# Patient Record
Sex: Female | Born: 1940 | Race: White | Hispanic: No | Marital: Single | Smoking: Current every day smoker
Health system: Southern US, Community
[De-identification: ages and names within clinical notes are randomized; demographics above are authoritative.]

## PROBLEM LIST (undated history)

## (undated) DIAGNOSIS — N3010 Interstitial cystitis (chronic) without hematuria: Secondary | ICD-10-CM

## (undated) DIAGNOSIS — M810 Age-related osteoporosis without current pathological fracture: Secondary | ICD-10-CM

## (undated) DIAGNOSIS — G8929 Other chronic pain: Secondary | ICD-10-CM

## (undated) DIAGNOSIS — I10 Essential (primary) hypertension: Secondary | ICD-10-CM

## (undated) DIAGNOSIS — C439 Malignant melanoma of skin, unspecified: Secondary | ICD-10-CM

## (undated) DIAGNOSIS — K449 Diaphragmatic hernia without obstruction or gangrene: Secondary | ICD-10-CM

## (undated) DIAGNOSIS — R3982 Chronic bladder pain: Secondary | ICD-10-CM

## (undated) DIAGNOSIS — R112 Nausea with vomiting, unspecified: Secondary | ICD-10-CM

## (undated) DIAGNOSIS — E7800 Pure hypercholesterolemia, unspecified: Secondary | ICD-10-CM

## (undated) DIAGNOSIS — Z8489 Family history of other specified conditions: Secondary | ICD-10-CM

## (undated) DIAGNOSIS — Z9889 Other specified postprocedural states: Secondary | ICD-10-CM

## (undated) DIAGNOSIS — F410 Panic disorder [episodic paroxysmal anxiety] without agoraphobia: Secondary | ICD-10-CM

## (undated) DIAGNOSIS — M1990 Unspecified osteoarthritis, unspecified site: Secondary | ICD-10-CM

## (undated) DIAGNOSIS — T4145XA Adverse effect of unspecified anesthetic, initial encounter: Secondary | ICD-10-CM

## (undated) DIAGNOSIS — E890 Postprocedural hypothyroidism: Secondary | ICD-10-CM

## (undated) DIAGNOSIS — E039 Hypothyroidism, unspecified: Secondary | ICD-10-CM

## (undated) DIAGNOSIS — K219 Gastro-esophageal reflux disease without esophagitis: Secondary | ICD-10-CM

## (undated) DIAGNOSIS — Z8659 Personal history of other mental and behavioral disorders: Secondary | ICD-10-CM

## (undated) DIAGNOSIS — K5790 Diverticulosis of intestine, part unspecified, without perforation or abscess without bleeding: Secondary | ICD-10-CM

## (undated) DIAGNOSIS — K08109 Complete loss of teeth, unspecified cause, unspecified class: Secondary | ICD-10-CM

## (undated) DIAGNOSIS — T8859XA Other complications of anesthesia, initial encounter: Secondary | ICD-10-CM

## (undated) DIAGNOSIS — Z972 Presence of dental prosthetic device (complete) (partial): Secondary | ICD-10-CM

## (undated) DIAGNOSIS — Z973 Presence of spectacles and contact lenses: Secondary | ICD-10-CM

## (undated) DIAGNOSIS — M545 Low back pain, unspecified: Secondary | ICD-10-CM

## (undated) HISTORY — PX: APPENDECTOMY: SHX54

## (undated) HISTORY — PX: TONSILLECTOMY: SUR1361

## (undated) HISTORY — PX: THYROIDECTOMY, PARTIAL: SHX18

## (undated) HISTORY — PX: TOTAL ABDOMINAL HYSTERECTOMY W/ BILATERAL SALPINGOOPHORECTOMY: SHX83

## (undated) HISTORY — PX: OVARIAN CYST REMOVAL: SHX89

## (undated) HISTORY — PX: SHOULDER SURGERY: SHX246

## (undated) HISTORY — PX: BASAL CELL CARCINOMA EXCISION: SHX1214

---
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
## 1969-07-24 HISTORY — PX: TOTAL ABDOMINAL HYSTERECTOMY W/ BILATERAL SALPINGOOPHORECTOMY: SHX83

## 1976-07-24 DIAGNOSIS — C439 Malignant melanoma of skin, unspecified: Secondary | ICD-10-CM

## 1976-07-24 DIAGNOSIS — Z85820 Personal history of malignant melanoma of skin: Secondary | ICD-10-CM

## 1976-07-24 HISTORY — DX: Personal history of malignant melanoma of skin: Z85.820

## 1976-07-24 HISTORY — DX: Malignant melanoma of skin, unspecified: C43.9

## 2000-02-28 ENCOUNTER — Encounter: Admission: RE | Admit: 2000-02-28 | Discharge: 2000-02-28 | Payer: Self-pay | Admitting: Urology

## 2000-02-28 ENCOUNTER — Encounter: Payer: Self-pay | Admitting: Urology

## 2001-09-06 ENCOUNTER — Emergency Department (HOSPITAL_COMMUNITY): Admission: EM | Admit: 2001-09-06 | Discharge: 2001-09-06 | Payer: Self-pay

## 2002-07-24 HISTORY — PX: SHOULDER ARTHROSCOPY: SHX128

## 2003-01-30 ENCOUNTER — Ambulatory Visit (HOSPITAL_BASED_OUTPATIENT_CLINIC_OR_DEPARTMENT_OTHER): Admission: RE | Admit: 2003-01-30 | Discharge: 2003-01-31 | Payer: Self-pay | Admitting: Orthopedic Surgery

## 2003-10-29 ENCOUNTER — Emergency Department (HOSPITAL_COMMUNITY): Admission: EM | Admit: 2003-10-29 | Discharge: 2003-10-29 | Payer: Self-pay | Admitting: Emergency Medicine

## 2004-07-24 HISTORY — PX: SHOULDER OPEN ROTATOR CUFF REPAIR: SHX2407

## 2004-10-07 ENCOUNTER — Encounter: Admission: RE | Admit: 2004-10-07 | Discharge: 2004-10-07 | Payer: Self-pay | Admitting: Orthopedic Surgery

## 2005-06-01 ENCOUNTER — Encounter: Admission: RE | Admit: 2005-06-01 | Discharge: 2005-06-01 | Payer: Self-pay | Admitting: Internal Medicine

## 2006-06-01 ENCOUNTER — Emergency Department (HOSPITAL_COMMUNITY): Admission: EM | Admit: 2006-06-01 | Discharge: 2006-06-01 | Payer: Self-pay | Admitting: Emergency Medicine

## 2006-07-29 ENCOUNTER — Emergency Department (HOSPITAL_COMMUNITY): Admission: EM | Admit: 2006-07-29 | Discharge: 2006-07-29 | Payer: Self-pay | Admitting: Emergency Medicine

## 2007-06-06 ENCOUNTER — Ambulatory Visit (HOSPITAL_COMMUNITY): Admission: RE | Admit: 2007-06-06 | Discharge: 2007-06-06 | Payer: Self-pay | Admitting: Gastroenterology

## 2007-08-14 ENCOUNTER — Emergency Department (HOSPITAL_COMMUNITY): Admission: EM | Admit: 2007-08-14 | Discharge: 2007-08-15 | Payer: Self-pay | Admitting: Emergency Medicine

## 2007-08-26 ENCOUNTER — Emergency Department (HOSPITAL_COMMUNITY): Admission: EM | Admit: 2007-08-26 | Discharge: 2007-08-26 | Payer: Self-pay | Admitting: Family Medicine

## 2008-07-04 ENCOUNTER — Encounter: Admission: RE | Admit: 2008-07-04 | Discharge: 2008-07-04 | Payer: Self-pay | Admitting: Orthopedic Surgery

## 2008-10-09 ENCOUNTER — Emergency Department (HOSPITAL_COMMUNITY): Admission: EM | Admit: 2008-10-09 | Discharge: 2008-10-09 | Payer: Self-pay | Admitting: Family Medicine

## 2009-02-04 ENCOUNTER — Encounter: Admission: RE | Admit: 2009-02-04 | Discharge: 2009-02-04 | Payer: Self-pay | Admitting: Internal Medicine

## 2009-10-27 ENCOUNTER — Emergency Department (HOSPITAL_COMMUNITY): Admission: EM | Admit: 2009-10-27 | Discharge: 2009-10-27 | Payer: Self-pay | Admitting: Family Medicine

## 2009-11-25 ENCOUNTER — Telehealth (INDEPENDENT_AMBULATORY_CARE_PROVIDER_SITE_OTHER): Payer: Self-pay | Admitting: *Deleted

## 2009-11-29 ENCOUNTER — Ambulatory Visit: Payer: Self-pay | Admitting: Internal Medicine

## 2009-11-29 ENCOUNTER — Ambulatory Visit: Payer: Self-pay

## 2009-11-29 ENCOUNTER — Encounter (HOSPITAL_COMMUNITY): Admission: RE | Admit: 2009-11-29 | Discharge: 2010-01-21 | Payer: Self-pay | Admitting: Internal Medicine

## 2010-08-25 NOTE — Assessment & Plan Note (Signed)
Summary: Cardiology Nuclear Study  Nuclear Med Background Indications for Stress Test: Evaluation for Ischemia   History: Asthma, CT/MRI, History of Chemo, Myocardial Perfusion Study  History Comments: 7-8 yrs ago([place]) MPS:OK per patient.  Symptoms: Chest Tightness, DOE, Fatigue, Rapid HR, SOB  Symptoms Comments: h/o anxiety/panic attacks. Last episode of CP:now, "ache"; tightness, last week.   Nuclear Pre-Procedure Cardiac Risk Factors: Family History - CAD, History of Smoking, Hypertension, Lipids Caffeine/Decaff Intake: None NPO After: 8:00 PM Lungs: Clear IV 0.9% NS with Angio Cath: 20g     IV Site: (R) AC IV Started by: Irean Hong RN Chest Size (in) 36     Cup Size C     Height (in): 60 Weight (lb): 138 BMI: 27.05 Tech Comments: Atenolol held x 24 hours  Nuclear Med Study 1 or 2 day study:  1 day     Stress Test Type:  Stress Reading MD:  Dietrich Pates, MD     Referring MD:  Larina Earthly, MD Resting Radionuclide:  Technetium 37m Tetrofosmin     Resting Radionuclide Dose:  11 mCi  Stress Radionuclide:  Technetium 62m Tetrofosmin     Stress Radionuclide Dose:  33 mCi   Stress Protocol Exercise Time (min):  7:31 min     Max HR:  142 bpm     Predicted Max HR:  154 bpm  Max Systolic BP: 158 mm Hg     Percent Max HR:  92.21 %     METS: 9.3 Rate Pressure Product:  32202    Stress Test Technologist:  Rea College CMA-N     Nuclear Technologist:  Domenic Polite CNMT  Rest Procedure  Myocardial perfusion imaging was performed at rest 45 minutes following the intravenous administration of Myoview Technetium 96m Tetrofosmin.  Stress Procedure  The patient exercised for  7:31.  The patient stopped due to fatigue.  She did c/o chest tightness, 5-6/10, with exercise that was relieved immediately with rest.  There were no diagnostic ST-T wave changes.  There were occasional PVC's and PAC's.  Myoview was injected at peak exercise and myocardial perfusion imaging was performed  after a brief delay.  QPS Raw Data Images:  Soft tissue (diaphragm, bowel activity, breast) surround heart. Stress Images:  There is normal uptake in all areas. Rest Images:  Normal homogeneous uptake in all areas of the myocardium. Subtraction (SDS):  No evidence of ischemia. Transient Ischemic Dilatation:  .96  (Normal <1.22)  Lung/Heart Ratio:  .33  (Normal <0.45)  Quantitative Gated Spect Images QGS EDV:  45 ml QGS ESV:  10 ml QGS EF:  78 %   Overall Impression  Exercise Capacity: Good exercise capacity. BP Response: Normal blood pressure response. Clinical Symptoms: Moderate chest pain. ECG Impression: No significant ST segment change suggestive of ischemia. Overall Impression: Normal stress nuclear study.

## 2010-08-25 NOTE — Progress Notes (Signed)
Summary: Nuclear Pre-Procedure  Phone Note Outgoing Call Call back at [facility] - [place] Phone [phone]   Call placed by: Stanton Kidney, EMT-P,  Nov 25, 2009 1:53 PM Action Taken: Phone Call Completed Summary of Call: Left message with information on Myoview Information Sheet (see scanned document for details).     Nuclear Med Background Indications for Stress Test: Evaluation for Ischemia   History: Asthma   Symptoms: Chest Pain    Nuclear Pre-Procedure Cardiac Risk Factors: Family History - CAD, History of Smoking, Lipids

## 2010-09-05 ENCOUNTER — Other Ambulatory Visit: Payer: Self-pay | Admitting: Orthopedic Surgery

## 2010-09-05 DIAGNOSIS — M25512 Pain in left shoulder: Secondary | ICD-10-CM

## 2010-09-07 ENCOUNTER — Ambulatory Visit
Admission: RE | Admit: 2010-09-07 | Discharge: 2010-09-07 | Disposition: A | Discharge status: Home / Self Care | Payer: Self-pay | Source: Ambulatory Visit | Attending: Orthopedic Surgery | Admitting: Orthopedic Surgery

## 2010-09-07 DIAGNOSIS — M25512 Pain in left shoulder: Secondary | ICD-10-CM

## 2010-12-09 NOTE — Op Note (Signed)
   [name], [name]                            ACCOUNT NO.:  [id number]   MEDICAL RECORD NO.:  [id number]                   PATIENT TYPE:  AMB   LOCATION:  DSC                                  FACILITY:  [facility]   PHYSICIAN:  Thera Flake., M.D.             DATE OF BIRTH:  23-Aug-1940   DATE OF PROCEDURE:  01/30/2003  DATE OF DISCHARGE:                                 OPERATIVE REPORT   INDICATIONS:  A 70 year old female with relatively severe left shoulder pain  not responding to conservative treatment, thought to be amenable to  outpatient arthroscopy, possible overnight.   PREOPERATIVE DIAGNOSES:  1. Torn anterior superior labrum.  2. Impingement, partial rotator cuff tear.  3. Partial biceps tendon tear.  4. Acromioclavicular joint arthritis.   POSTOPERATIVE DIAGNOSES:  1. Torn anterior superior labrum.  2. Impingement, partial rotator cuff tear.  3. Partial biceps tendon tear.  4. Acromioclavicular joint arthritis.   OPERATION:  1. Arthroscopic acromioplasty.  2. Arthroscopic debridement, torn labrum.  3. Arthroscopic excision, distal clavicle.   SURGEON:  Dyke Brackett, M.D.   ANESTHESIA:  General/block.   DESCRIPTION OF PROCEDURE:  Arthroscope through a posterior, lateral, and  anterior portal.  No degenerative change was seen in the joint.  There was  significant degenerative tearing in the anterior superior labrum.  A portion  of the biceps had flipped into the joint relative to an attritional-type  tear.  No instability of the biceps was noted.  Aggressive debridement of  the glenohumeral joint and labral tear was carried out separate from the  remainder of the debridement.  Diffuse tendinosis with partial cuff  thickness tear estimated at about 25% of thickness, which was debrided.  No  full-thickness tear appreciated.  The subacromial space was hyperemic and  inflamed.  The arthroscope was inserted in the subacromial space, followed  by aggressive  debridement of the bursa, acromioplasty, and distal clavicle  resection of about 1 cm of the distal clavicle.  This relieved the  impingement from the clavicle, degenerative change on the clavicle,  impingement from the edge of the acromion.  Superior _________ cuff showed  abrasion-  type tearing only without any overt severe tearing.  This was debrided as  well.  Shoulder drained free of fluid, the portals closed with nylon, a  lightly compressive dressing and sling applied.  Taken to the recovery room  in stable condition.                                               Thera Flake., M.D.    WDC/MEDQ  D:  01/30/2003  T:  01/31/2003  Job:  [id number]

## 2012-07-24 HISTORY — PX: MOHS SURGERY: SUR867

## 2014-03-03 ENCOUNTER — Other Ambulatory Visit: Payer: Self-pay | Admitting: Internal Medicine

## 2014-03-03 DIAGNOSIS — R109 Unspecified abdominal pain: Secondary | ICD-10-CM

## 2014-03-05 ENCOUNTER — Other Ambulatory Visit: Payer: Self-pay | Admitting: Internal Medicine

## 2014-03-05 ENCOUNTER — Ambulatory Visit
Admission: RE | Admit: 2014-03-05 | Discharge: 2014-03-05 | Disposition: A | Discharge status: Home / Self Care | Payer: Medicare Other | Source: Ambulatory Visit | Attending: Internal Medicine | Admitting: Internal Medicine

## 2014-03-05 DIAGNOSIS — R109 Unspecified abdominal pain: Secondary | ICD-10-CM

## 2014-03-05 HISTORY — DX: Pure hypercholesterolemia, unspecified: E78.00

## 2014-03-05 HISTORY — DX: Essential (primary) hypertension: I10

## 2016-04-27 ENCOUNTER — Emergency Department (HOSPITAL_COMMUNITY)
Admission: EM | Admit: 2016-04-27 | Discharge: 2016-04-27 | Disposition: A | Discharge status: Home / Self Care | Payer: Medicare Other | Attending: Emergency Medicine | Admitting: Emergency Medicine

## 2016-04-27 ENCOUNTER — Encounter (HOSPITAL_COMMUNITY): Payer: Self-pay | Admitting: *Deleted

## 2016-04-27 ENCOUNTER — Emergency Department (HOSPITAL_COMMUNITY): Payer: Medicare Other

## 2016-04-27 DIAGNOSIS — Z7982 Long term (current) use of aspirin: Secondary | ICD-10-CM | POA: Diagnosis not present

## 2016-04-27 DIAGNOSIS — Z79899 Other long term (current) drug therapy: Secondary | ICD-10-CM | POA: Diagnosis not present

## 2016-04-27 DIAGNOSIS — S52571A Other intraarticular fracture of lower end of right radius, initial encounter for closed fracture: Secondary | ICD-10-CM

## 2016-04-27 DIAGNOSIS — Y9389 Activity, other specified: Secondary | ICD-10-CM | POA: Diagnosis not present

## 2016-04-27 DIAGNOSIS — Y92096 Garden or yard of other non-institutional residence as the place of occurrence of the external cause: Secondary | ICD-10-CM | POA: Insufficient documentation

## 2016-04-27 DIAGNOSIS — S52615A Nondisplaced fracture of left ulna styloid process, initial encounter for closed fracture: Secondary | ICD-10-CM | POA: Insufficient documentation

## 2016-04-27 DIAGNOSIS — Z87891 Personal history of nicotine dependence: Secondary | ICD-10-CM | POA: Insufficient documentation

## 2016-04-27 DIAGNOSIS — W1839XA Other fall on same level, initial encounter: Secondary | ICD-10-CM | POA: Diagnosis not present

## 2016-04-27 DIAGNOSIS — S6992XA Unspecified injury of left wrist, hand and finger(s), initial encounter: Secondary | ICD-10-CM | POA: Diagnosis present

## 2016-04-27 DIAGNOSIS — S52572A Other intraarticular fracture of lower end of left radius, initial encounter for closed fracture: Secondary | ICD-10-CM | POA: Insufficient documentation

## 2016-04-27 DIAGNOSIS — I10 Essential (primary) hypertension: Secondary | ICD-10-CM | POA: Insufficient documentation

## 2016-04-27 DIAGNOSIS — Y999 Unspecified external cause status: Secondary | ICD-10-CM | POA: Diagnosis not present

## 2016-04-27 HISTORY — DX: Panic disorder (episodic paroxysmal anxiety): F41.0

## 2016-04-27 HISTORY — DX: Diaphragmatic hernia without obstruction or gangrene: K44.9

## 2016-04-27 LAB — BASIC METABOLIC PANEL
Anion gap: 10 (ref 5–15)
BUN: 11 mg/dL (ref 6–20)
CHLORIDE: 108 mmol/L (ref 101–111)
CO2: 22 mmol/L (ref 22–32)
Calcium: 8.7 mg/dL — ABNORMAL LOW (ref 8.9–10.3)
Creatinine, Ser: 0.67 mg/dL (ref 0.44–1.00)
GFR calc Af Amer: >60 mL/min (ref >60)
Glucose, Bld: 96 mg/dL (ref 65–99)
POTASSIUM: 4.2 mmol/L (ref 3.5–5.1)
SODIUM: 140 mmol/L (ref 135–145)

## 2016-04-27 LAB — CBC
HEMATOCRIT: 36.8 % (ref 36.0–46.0)
HEMOGLOBIN: 11.9 g/dL — AB (ref 12.0–15.0)
MCH: 30.1 pg (ref 26.0–34.0)
MCHC: 32.3 g/dL (ref 30.0–36.0)
MCV: 92.9 fL (ref 78.0–100.0)
Platelets: 216 10*3/uL (ref 150–400)
RBC: 3.96 MIL/uL (ref 3.87–5.11)
RDW: 14.2 % (ref 11.5–15.5)
WBC: 10.9 10*3/uL — ABNORMAL HIGH (ref 4.0–10.5)

## 2016-04-27 MED ORDER — HYDROMORPHONE HCL 1 MG/ML IJ SOLN
0.5000 mg | Freq: Once | INTRAMUSCULAR | Status: AC
  Administered 2016-04-27: 0.5 mg via INTRAVENOUS
  Filled 2016-04-27: qty 1

## 2016-04-27 MED ORDER — HYDROCODONE-ACETAMINOPHEN 5-325 MG PO TABS: 1.0000 | ORAL_TABLET | ORAL | 0 refills | Status: DC | PRN

## 2016-04-27 MED ORDER — HYDROCODONE-ACETAMINOPHEN 5-325 MG PO TABS
2.0000 | ORAL_TABLET | Freq: Once | ORAL | Status: AC
  Administered 2016-04-27: 2 via ORAL
  Filled 2016-04-27: qty 2

## 2016-04-27 NOTE — ED Notes (Signed)
MD at bedside. 

## 2016-04-27 NOTE — ED Provider Notes (Signed)
[facility] DEPT Provider Note   CSN: [id number] Arrival date & time: 04/27/16  1935     History   Chief Complaint Chief Complaint  Patient presents with  . Fall  . Wrist Pain  . Hypotension    HPI Karina [name] is a 75 y.o. female.  HPI Patient was doing yard work and she fell. She reports she put her arms out front of her to break her fall. Patient ended up landing on her right hand and wrist. She reports that she did not sustain any other injury. She reports she has a lot of swelling and pain in the right wrist now.  While in the waiting room, the patient had an episode of hypotension. As that she does have a tendency to get anxious and felt like she was getting panicky. At that time she got lightheaded and sweaty. Those symptoms have resolved. She reports she is more comfortable now resting in the bed. Past Medical History:  Diagnosis Date  . Hiatal hernia   . HTN (hypertension)   . Hypercholesterolemia   . Panic attacks     There are no active problems to display for this patient.   Past Surgical History:  Procedure Laterality Date  . OVARIAN CYST REMOVAL     x 2  . TOTAL ABDOMINAL HYSTERECTOMY W/ BILATERAL SALPINGOOPHORECTOMY      OB History    No data available       Home Medications    Prior to Admission medications   Medication Sig Start Date End Date Taking? Authorizing Provider  acetaminophen (TYLENOL) 325 MG tablet Take 325 mg by mouth every 6 (six) hours as needed for moderate pain.   Yes Historical Provider, MD  ALPRAZolam Duanne Moron) 0.5 MG tablet Take 0.5 mg by mouth 2 (two) times daily as needed. 03/30/16  Yes Historical Provider, MD  aspirin EC 81 MG tablet Take 81 mg by mouth daily.   Yes Historical Provider, MD  atenolol (TENORMIN) 50 MG tablet Take 50 mg by mouth daily. 02/13/16  Yes Historical Provider, MD  celecoxib (CELEBREX) 200 MG capsule Take 200 mg by mouth daily as needed. For neuropathy 02/08/16  Yes Historical Provider, MD    lisinopril-hydrochlorothiazide (PRINZIDE,ZESTORETIC) 20-25 MG tablet Take 1 tablet by mouth daily. 02/13/16  Yes Historical Provider, MD  Meth-Hyo-M Bl-Na Phos-Ph Sal (URIBEL) 118 MG CAPS Take 118 mg by mouth daily as needed (bladder).   Yes Historical Provider, MD  risedronate (ACTONEL) 150 MG tablet Take 150 mg by mouth every 30 (thirty) days. 03/13/16  Yes Historical Provider, MD  sertraline (ZOLOFT) 100 MG tablet Take 100 mg by mouth every morning. 03/07/16  Yes Historical Provider, MD  SYNTHROID 75 MCG tablet Take 75 mcg by mouth daily before breakfast. 03/07/16  Yes Historical Provider, MD  ZETIA 10 MG tablet Take 10 mg by mouth every evening. 04/16/16  Yes Historical Provider, MD  HYDROcodone-acetaminophen (NORCO/VICODIN) 5-325 MG tablet Take 1-2 tablets by mouth every 4 (four) hours as needed for moderate pain or severe pain. 04/27/16   [name], MD    Family History No family history on file.  Social History Social History  Substance Use Topics  . Smoking status: Former Smoker    Packs/day: 1.00    Years: 35.00    Types: Cigarettes    Quit date: 07/24/2008  . Smokeless tobacco: Never Used  . Alcohol use No     Allergies   Morphine and Propoxyphene hcl   Review of Systems Review of  Systems  10 Systems reviewed and are negative for acute change except as noted in the HPI.  Physical Exam Updated Vital Signs BP 111/75 (BP Location: Right Arm)   Pulse 67   Temp 97.4 F (36.3 C) (Oral)   Resp 18   Ht 5' (1.524 m)   Wt 127 lb (57.6 kg)   SpO2 94%   BMI 24.80 kg/m   Physical Exam  Constitutional: She is oriented to person, place, and time. She appears well-developed and well-nourished. No distress.  HENT:  Head: Normocephalic and atraumatic.  Nose: Nose normal.  Eyes: Conjunctivae and EOM are normal.  Neck: Neck supple.  Cardiovascular: Normal rate and regular rhythm.   No murmur heard. Pulmonary/Chest: Effort normal and breath sounds normal. No respiratory  distress. She exhibits no tenderness.  Abdominal: Soft. There is no tenderness.  Musculoskeletal: She exhibits tenderness and deformity.  Left wrist has swelling and deformity. Radial pulses intact. Patient has intact range of motion of the fingers. Fingers are warm and dry.  Lower extremities are normal. Patient has good range of motion with in tach strength to push and flex and extend.  Neurological: She is alert and oriented to person, place, and time. No cranial nerve deficit. She exhibits normal muscle tone. Coordination normal.  Skin: Skin is warm and dry.  Psychiatric: She has a normal mood and affect.  Nursing note and vitals reviewed.    ED Treatments / Results  Labs (all labs ordered are listed, but only abnormal results are displayed) Labs Reviewed  CBC - Abnormal; Notable for the following:       Result Value   WBC 10.9 (*)    Hemoglobin 11.9 (*)    All other components within normal limits  BASIC METABOLIC PANEL - Abnormal; Notable for the following:    Calcium 8.7 (*)    All other components within normal limits    EKG  EKG Interpretation None       Radiology Dg Forearm Left  Result Date: 04/27/2016 CLINICAL DATA:  Fall with wrist deformity EXAM: LEFT FOREARM - 2 VIEW COMPARISON:  None. FINDINGS: AP and lateral views of the left forearm. No significant elbow effusion. Radial head alignment is normal. Comminuted, dorsally displaced intra-articular fracture of the distal radius. Ulnar styloid process fracture is better seen on the wrist exam. IMPRESSION: Comminuted intra-articular fracture of the distal radius. Electronically Signed   By: Donavan Foil M.D.   On: 04/27/2016 22:05   Dg Wrist Complete Left  Result Date: 04/27/2016 CLINICAL DATA:  Fall with deformity of the left wrist EXAM: LEFT WRIST - COMPLETE 3+ VIEW COMPARISON:  None. FINDINGS: AP, oblique and lateral views of the left wrist demonstrate a comminuted, impacted intra-articular fracture of the  distal radius. There is 1/3 shaft diameter of dorsal displacement of the distal fracture fragment. Additional nondisplaced fracture of the ulnar-styloid process. IMPRESSION: 1. Comminuted, dorsally displaced, impacted intra-articular distal radius fracture 2. Nondisplaced ulnar styloid process fracture Electronically Signed   By: Donavan Foil M.D.   On: 04/27/2016 22:04    Procedures Procedures (including critical care time)  Medications Ordered in ED Medications  HYDROmorphone (DILAUDID) injection 0.5 mg (0.5 mg Intravenous Given 04/27/16 2241)  HYDROcodone-acetaminophen (NORCO/VICODIN) 5-325 MG per tablet 2 tablet (2 tablets Oral Given 04/27/16 2334)     Initial Impression / Assessment and Plan / ED Course  I have reviewed the triage vital signs and the nursing notes.  Pertinent labs & imaging results that were available during  my care of the patient were reviewed by me and considered in my medical decision making (see chart for details).  Clinical Course   patient has been placed in a sugar tong splint by orthopedic tec. Patient is neurovascularly intact post splinting.  Final Clinical Impressions(s) / ED Diagnoses   Final diagnoses:  Other closed intra-articular fracture of distal end of right radius, initial encounter  Patient is isolated wrist fracture. No other associated injury. Patient is neurovascularly intact. Splint has been placed. Patient did have an episode of hypotension and lightheadedness in the waiting room. This is consistent with vasovagal episode secondary to pain. She did not sustain other injury or have chest pain or shortness of breath to suggest any type of ischemic event. He is counseled on taking Vicodin for pain control also she will use ibuprofen for anti-inflammatory. Patient will elevate and ice the extremity. Splint care precautions have been reviewed.  New Prescriptions New Prescriptions   HYDROCODONE-ACETAMINOPHEN (NORCO/VICODIN) 5-325 MG TABLET    Take  1-2 tablets by mouth every 4 (four) hours as needed for moderate pain or severe pain.     [name], MD 04/27/16 2350

## 2016-04-27 NOTE — ED Triage Notes (Signed)
Patient stated she was working in the yard and stepped back falling backwards landing on her left arm.  Obvious deformity to left wrist, c/o feeling nauseated and feeling like she is going to pass out   Patient pale at this time

## 2016-04-27 NOTE — ED Notes (Signed)
Patient transported to X-ray 

## 2016-04-27 NOTE — Progress Notes (Signed)
Orthopedic Tech Progress Note Patient Details:  Karina [name] 01-21-1941 [name]  Ortho Devices Type of Ortho Device: Arm sling, Sugartong splint Ortho Device/Splint Location: Well padded fiberglass sugartong splint to Lt arm, Sling Lt arm Ortho Device/Splint Interventions: Ordered, Application   [name] 04/27/2016, 11:14 PM

## 2016-07-24 HISTORY — PX: ORIF WRIST FRACTURE: SHX2133

## 2017-03-28 ENCOUNTER — Encounter (HOSPITAL_COMMUNITY): Payer: Self-pay | Admitting: Emergency Medicine

## 2017-03-28 ENCOUNTER — Emergency Department (HOSPITAL_COMMUNITY): Payer: Medicare Other

## 2017-03-28 ENCOUNTER — Emergency Department (HOSPITAL_COMMUNITY)
Admission: EM | Admit: 2017-03-28 | Discharge: 2017-03-28 | Disposition: A | Discharge status: Home / Self Care | Payer: Medicare Other | Attending: Emergency Medicine | Admitting: Emergency Medicine

## 2017-03-28 DIAGNOSIS — S0993XA Unspecified injury of face, initial encounter: Secondary | ICD-10-CM | POA: Diagnosis present

## 2017-03-28 DIAGNOSIS — Y929 Unspecified place or not applicable: Secondary | ICD-10-CM | POA: Diagnosis not present

## 2017-03-28 DIAGNOSIS — I10 Essential (primary) hypertension: Secondary | ICD-10-CM | POA: Diagnosis not present

## 2017-03-28 DIAGNOSIS — Y9301 Activity, walking, marching and hiking: Secondary | ICD-10-CM | POA: Diagnosis not present

## 2017-03-28 DIAGNOSIS — E7800 Pure hypercholesterolemia, unspecified: Secondary | ICD-10-CM | POA: Diagnosis not present

## 2017-03-28 DIAGNOSIS — S022XXA Fracture of nasal bones, initial encounter for closed fracture: Secondary | ICD-10-CM | POA: Diagnosis not present

## 2017-03-28 DIAGNOSIS — F17210 Nicotine dependence, cigarettes, uncomplicated: Secondary | ICD-10-CM | POA: Insufficient documentation

## 2017-03-28 DIAGNOSIS — W0110XA Fall on same level from slipping, tripping and stumbling with subsequent striking against unspecified object, initial encounter: Secondary | ICD-10-CM | POA: Diagnosis not present

## 2017-03-28 DIAGNOSIS — Z7982 Long term (current) use of aspirin: Secondary | ICD-10-CM | POA: Insufficient documentation

## 2017-03-28 DIAGNOSIS — Z79899 Other long term (current) drug therapy: Secondary | ICD-10-CM | POA: Diagnosis not present

## 2017-03-28 DIAGNOSIS — Y998 Other external cause status: Secondary | ICD-10-CM | POA: Diagnosis not present

## 2017-03-28 DIAGNOSIS — W19XXXA Unspecified fall, initial encounter: Secondary | ICD-10-CM

## 2017-03-28 HISTORY — DX: Diverticulosis of intestine, part unspecified, without perforation or abscess without bleeding: K57.90

## 2017-03-28 HISTORY — DX: Malignant melanoma of skin, unspecified: C43.9

## 2017-03-28 HISTORY — DX: Hypothyroidism, unspecified: E03.9

## 2017-03-28 HISTORY — DX: Age-related osteoporosis without current pathological fracture: M81.0

## 2017-03-28 MED ORDER — OXYCODONE-ACETAMINOPHEN 5-325 MG PO TABS: 1.0000 | ORAL_TABLET | Freq: Four times a day (QID) | ORAL | 0 refills | Status: DC | PRN

## 2017-03-28 MED ORDER — ONDANSETRON 4 MG PO TBDP
4.0000 mg | ORAL_TABLET | Freq: Once | ORAL | Status: AC | PRN
  Administered 2017-03-28: 4 mg via ORAL

## 2017-03-28 MED ORDER — ONDANSETRON 4 MG PO TBDP
ORAL_TABLET | ORAL | Status: AC
  Filled 2017-03-28: qty 1

## 2017-03-28 MED ORDER — SALINE SPRAY 0.65 % NA SOLN
1.0000 | Freq: Once | NASAL | Status: AC
  Administered 2017-03-28: 1 via NASAL
  Filled 2017-03-28: qty 44

## 2017-03-28 MED ORDER — OXYCODONE-ACETAMINOPHEN 5-325 MG PO TABS
1.0000 | ORAL_TABLET | Freq: Once | ORAL | Status: AC
  Administered 2017-03-28: 1 via ORAL
  Filled 2017-03-28: qty 1

## 2017-03-28 MED ORDER — OXYCODONE-ACETAMINOPHEN 5-325 MG PO TABS
1.0000 | ORAL_TABLET | ORAL | Status: DC | PRN
  Administered 2017-03-28: 1 via ORAL

## 2017-03-28 MED ORDER — OXYCODONE-ACETAMINOPHEN 5-325 MG PO TABS
ORAL_TABLET | ORAL | Status: AC
  Filled 2017-03-28: qty 1

## 2017-03-28 NOTE — ED Provider Notes (Signed)
[facility] DEPT Provider Note   CSN: [id number] Arrival date & time: 03/28/17  0020     History   Chief Complaint Chief Complaint  Patient presents with  . Fall    HPI Anis Cinelli is a 76 y.o. female.  HPI  Vision presents after a fall with facial pain. Patient recalls a fall in its entirety, stating that she fell while walking her dog. She fell forward onto her face, right elbow. Since that time she has had pain in the face, with no malocclusion, no confusion, no disorientation, no loss of consciousness, no vision changes. She had some improvement in the pain level after she was provided Percocet here. There is also persistent pain in the right elbow, but no distal loss of sensation, strength, weakness. Patient was well prior to the fall. She is here with her daughter who assists with the history of present illness.  Past Medical History:  Diagnosis Date  . Diverticulosis   . Hiatal hernia   . HTN (hypertension)   . Hypercholesterolemia   . Hypothyroidism   . Melanoma ([place]) 1978   chemo  . Osteoporosis   . Panic attacks     There are no active problems to display for this patient.   Past Surgical History:  Procedure Laterality Date  . OVARIAN CYST REMOVAL     x 2  . TOTAL ABDOMINAL HYSTERECTOMY W/ BILATERAL SALPINGOOPHORECTOMY      OB History    No data available       Home Medications    Prior to Admission medications   Medication Sig Start Date End Date Taking? Authorizing Provider  acetaminophen (TYLENOL) 325 MG tablet Take 325 mg by mouth every 6 (six) hours as needed for moderate pain.    [provider]  ALPRAZolam Duanne Moron) 0.5 MG tablet Take 0.5 mg by mouth 2 (two) times daily as needed. 03/30/16   [provider]  aspirin EC 81 MG tablet Take 81 mg by mouth daily.    [provider]  atenolol (TENORMIN) 50 MG tablet Take 50 mg by mouth daily. 02/13/16   [provider]  celecoxib (CELEBREX) 200 MG capsule  Take 200 mg by mouth daily as needed. For neuropathy 02/08/16   [provider]  HYDROcodone-acetaminophen (NORCO/VICODIN) 5-325 MG tablet Take 1-2 tablets by mouth every 4 (four) hours as needed for moderate pain or severe pain. 04/27/16   [name], MD  lisinopril-hydrochlorothiazide (PRINZIDE,ZESTORETIC) 20-25 MG tablet Take 1 tablet by mouth daily. 02/13/16   [provider]  Meth-Hyo-M Bl-Na Phos-Ph Sal (URIBEL) 118 MG CAPS Take 118 mg by mouth daily as needed (bladder).    [provider]  risedronate (ACTONEL) 150 MG tablet Take 150 mg by mouth every 30 (thirty) days. 03/13/16   [provider]  sertraline (ZOLOFT) 100 MG tablet Take 100 mg by mouth every morning. 03/07/16   [provider]  SYNTHROID 75 MCG tablet Take 75 mcg by mouth daily before breakfast. 03/07/16   [provider]  ZETIA 10 MG tablet Take 10 mg by mouth every evening. 04/16/16   [provider]    Family History No family history on file.  Social History Social History  Substance Use Topics  . Smoking status: Current Some Day Smoker    Packs/day: 0.10    Years: 35.00    Types: Cigarettes    Last attempt to quit: 07/24/2008  . Smokeless tobacco: Never Used  . Alcohol use No  Allergies   Morphine and Propoxyphene hcl   Review of Systems Review of Systems  Constitutional:       Per HPI, otherwise negative  HENT:       Per HPI, otherwise negative  Respiratory:       Per HPI, otherwise negative  Cardiovascular:       Per HPI, otherwise negative  Gastrointestinal: Negative for vomiting.  Endocrine:       Negative aside from HPI  Genitourinary:       Neg aside from HPI   Musculoskeletal:       Per HPI, otherwise negative  Skin: Positive for wound.  Allergic/Immunologic: Negative for immunocompromised state.  Neurological: Negative for syncope.  Hematological: Does not bruise/bleed easily.     Physical Exam Updated Vital  Signs BP (!) 135/97 (BP Location: Left Arm)   Pulse 60   Temp 98.4 F (36.9 C)   Resp 18   SpO2 94%   Physical Exam  Constitutional: She is oriented to person, place, and time. She appears well-developed and well-nourished. No distress.  HENT:  Head: Normocephalic.    Mouth/Throat:    Eyes: Conjunctivae and EOM are normal.  Neck: No spinous process tenderness and no muscular tenderness present. No neck rigidity. No erythema and normal range of motion present.  Cardiovascular: Normal rate and regular rhythm.   Pulmonary/Chest: Effort normal and breath sounds normal. No stridor. No respiratory distress.  Abdominal: She exhibits no distension.  Musculoskeletal: She exhibits no edema.       Right shoulder: Normal.       Right elbow: She exhibits normal range of motion, no swelling, no effusion, no deformity and no laceration. Tenderness found.       Right wrist: Normal.  Neurological: She is alert and oriented to person, place, and time. No cranial nerve deficit.  Skin: Skin is warm and dry.  Psychiatric: She has a normal mood and affect.  Nursing note and vitals reviewed.    ED Treatments / Results   Radiology Dg Elbow Complete Right  Result Date: 03/28/2017 CLINICAL DATA:  Right elbow pain after fall tonight. Painful range of motion. EXAM: RIGHT ELBOW - COMPLETE 3+ VIEW COMPARISON:  None. FINDINGS: There is no evidence of fracture, dislocation, or joint effusion. Mild only trochlear degenerative change with spurring. Spurring adjacent to the lateral epicondyle likely enthesopathic change. Soft tissues are unremarkable. IMPRESSION: No acute fracture subluxation.  Mild degenerative change. Electronically Signed   By: Jeb Levering M.D.   On: 03/28/2017 01:33   Ct Head Wo Contrast  Result Date: 03/28/2017 CLINICAL DATA:  Trip and fall injury. Laceration below the bottom lip. Small laceration to bridge of nose. EXAM: CT HEAD WITHOUT CONTRAST CT MAXILLOFACIAL WITHOUT CONTRAST  TECHNIQUE: Multidetector CT imaging of the head and maxillofacial structures were performed using the standard protocol without intravenous contrast. Multiplanar CT image reconstructions of the maxillofacial structures were also generated. COMPARISON:  CT maxillofacial 08/14/2007 FINDINGS: CT HEAD FINDINGS Brain: No evidence of acute infarction, hemorrhage, hydrocephalus, extra-axial collection or mass lesion/mass effect. Vascular: No hyperdense vessel or unexpected calcification. Skull: Normal. Negative for fracture or focal lesion. Other: None. CT MAXILLOFACIAL FINDINGS Osseous: Mildly depressed nasal bone fractures most prominent on the left. Frontal bones, orbital rims, maxillary antral walls, pterygoid plates, zygomatic arches, mandibles, and temporomandibular joints are intact. Orbits: Negative. No traumatic or inflammatory finding. Sinuses: Clear. Soft tissues: Soft tissue laceration anterior and superior to the anterior mandible. No radiopaque soft tissue foreign bodies. IMPRESSION:  1. No acute intracranial abnormalities. 2. Mildly depressed nasal bone fractures most prominent on the left. 3. Soft tissue laceration over the anterior mandible. Electronically Signed   By: Lucienne Capers M.D.   On: 03/28/2017 05:29   Ct Maxillofacial Wo Cm  Result Date: 03/28/2017 CLINICAL DATA:  Trip and fall injury. Laceration below the bottom lip. Small laceration to bridge of nose. EXAM: CT HEAD WITHOUT CONTRAST CT MAXILLOFACIAL WITHOUT CONTRAST TECHNIQUE: Multidetector CT imaging of the head and maxillofacial structures were performed using the standard protocol without intravenous contrast. Multiplanar CT image reconstructions of the maxillofacial structures were also generated. COMPARISON:  CT maxillofacial 08/14/2007 FINDINGS: CT HEAD FINDINGS Brain: No evidence of acute infarction, hemorrhage, hydrocephalus, extra-axial collection or mass lesion/mass effect. Vascular: No hyperdense vessel or unexpected  calcification. Skull: Normal. Negative for fracture or focal lesion. Other: None. CT MAXILLOFACIAL FINDINGS Osseous: Mildly depressed nasal bone fractures most prominent on the left. Frontal bones, orbital rims, maxillary antral walls, pterygoid plates, zygomatic arches, mandibles, and temporomandibular joints are intact. Orbits: Negative. No traumatic or inflammatory finding. Sinuses: Clear. Soft tissues: Soft tissue laceration anterior and superior to the anterior mandible. No radiopaque soft tissue foreign bodies. IMPRESSION: 1. No acute intracranial abnormalities. 2. Mildly depressed nasal bone fractures most prominent on the left. 3. Soft tissue laceration over the anterior mandible. Electronically Signed   By: Lucienne Capers M.D.   On: 03/28/2017 05:29    Procedures Procedures (including critical care time)  Medications Ordered in ED Medications  oxyCODONE-acetaminophen (PERCOCET/ROXICET) 5-325 MG per tablet 1 tablet (1 tablet Oral Given 03/28/17 0040)  sodium chloride (OCEAN) 0.65 % nasal spray 1 spray (not administered)  ondansetron (ZOFRAN-ODT) disintegrating tablet 4 mg (4 mg Oral Given 03/28/17 0045)  oxyCODONE-acetaminophen (PERCOCET/ROXICET) 5-325 MG per tablet 1 tablet (1 tablet Oral Given 03/28/17 0421)     Initial Impression / Assessment and Plan / ED Course  I have reviewed the triage vital signs and the nursing notes.  Pertinent labs & imaging results that were available during my care of the patient were reviewed by me and considered in my medical decision making (see chart for details).  5:48 AM Patient awake and alert, in no distress, no active bleeding from the chin wound. We discussed all findings, with the patient's daughter. We discussed the importance of using nasal spray for hydration, close outpatient follow-up with ENT, and home wound care. With no evidence for intracranial pathology, no neurologic deficits, no distress, the patient was discharged in stable  condition.  Final Clinical Impressions(s) / ED Diagnoses  fall, initial encounter Nasal bonefracture   Carmin Muskrat, MD 03/28/17 [phone]

## 2017-03-28 NOTE — Discharge Instructions (Signed)
Please be sure to follow-up with our ENT colleagues. In the interim, please monitor your condition carefully, and do not hesitate to return here for concerning changes.

## 2017-03-28 NOTE — ED Notes (Signed)
Patient left at this time with all belongings. 

## 2017-03-28 NOTE — ED Triage Notes (Addendum)
Pt to ED following fall while out walking her dog this evening. Pt states her flip flop got underneath her foot and she tripped. Pt fell face forward onto gravel - she has lac below bottom lip going through to her gums, and small lac to bridge of nose. Nose is swollen and bruised. Pt states her nose feels numb. Pt also c/o R elbow pain, worse with movement. CSM intact all extremities. Pt denies LOC. Denies dizziness/lightheadedness, no headache.

## 2017-03-28 NOTE — ED Notes (Signed)
To CT at this time.

## 2017-11-05 ENCOUNTER — Ambulatory Visit (INDEPENDENT_AMBULATORY_CARE_PROVIDER_SITE_OTHER): Payer: Self-pay

## 2017-11-05 ENCOUNTER — Ambulatory Visit (INDEPENDENT_AMBULATORY_CARE_PROVIDER_SITE_OTHER): Payer: Medicare Other | Admitting: Orthopaedic Surgery

## 2017-11-05 ENCOUNTER — Encounter (INDEPENDENT_AMBULATORY_CARE_PROVIDER_SITE_OTHER): Payer: Self-pay | Admitting: Orthopaedic Surgery

## 2017-11-05 VITALS — BP 108/76 | HR 71 | Resp 16 | Ht <= 58 in | Wt 126.0 lb

## 2017-11-05 DIAGNOSIS — M25561 Pain in right knee: Secondary | ICD-10-CM

## 2017-11-05 DIAGNOSIS — M25551 Pain in right hip: Secondary | ICD-10-CM

## 2017-11-05 MED ORDER — BUPIVACAINE HCL 0.5 % IJ SOLN
2.0000 mL | INTRAMUSCULAR | Status: AC | PRN
  Administered 2017-11-05: 2 mL via INTRA_ARTICULAR

## 2017-11-05 MED ORDER — METHYLPREDNISOLONE ACETATE 40 MG/ML IJ SUSP
80.0000 mg | INTRAMUSCULAR | Status: AC | PRN
  Administered 2017-11-05: 80 mg

## 2017-11-05 MED ORDER — LIDOCAINE HCL 1 % IJ SOLN
2.0000 mL | INTRAMUSCULAR | Status: AC | PRN
  Administered 2017-11-05: 2 mL

## 2017-11-05 NOTE — Progress Notes (Signed)
XR PELVIS0   Office Visit Note   Patient: Karina [name]           Date of Birth: Jan 14, 1941           MRN: [id number] Visit Date: 11/05/2017              Requested by: Prince Solian, MD [address] [place], [place] [address] PCP: Prince Solian, MD   Assessment & Plan: Visit Diagnoses:  1. Acute pain of right knee   2. Pain in right hip     Plan: Osteoarthritis right knee and right hip.  I think both contribute to her right lower extremity pain.  She does have more pain and swelling in her right knee.  I will inject this with cortisone and monitor response over the next several weeks  Follow-Up Instructions: No follow-ups on file.   Orders:  Orders Placed This Encounter  Procedures  . XR Pelvis 1-2 Views  . XR KNEE 3 VIEW RIGHT   No orders of the defined types were placed in this encounter.     Procedures: Large Joint Inj: R knee on 11/05/2017 11:51 AM Indications: pain and diagnostic evaluation Details: 25 G 1.5 in needle, anteromedial approach  Arthrogram: No  Medications: 2 mL lidocaine 1 %; 2 mL bupivacaine 0.5 %; 80 mg methylPREDNISolone acetate 40 MG/ML Procedure, treatment alternatives, risks and benefits explained, specific risks discussed. Consent was given by the patient. Immediately prior to procedure a time out was called to verify the correct patient, procedure, equipment, support staff and site/side marked as required. Patient was prepped and draped in the usual sterile fashion.       Clinical Data: No additional findings.   Subjective: Chief Complaint  Patient presents with  . Right Knee - Pain  . New Patient (Initial Visit)    R KNEE PAIN FOR 4 MONTHS RADIATES FROM R HIP, HAD MALONOMIA IN RIGHT CALF IN 75'S  Karina [name] noted insidious onset of right knee pain "just before Christmas".  She denies any injury or trauma.  Her right knee has been swollen and oftentimes "achy and stiff".  She has had difficulty bearing weight on occasion  particularly when it is "swollen".  Has had some groin pain and thigh pain as well.  No numbness or tingling.  HPI  Review of Systems  Constitutional: Positive for fatigue. Negative for fever.  HENT: Negative for ear pain.   Eyes: Negative for pain.  Respiratory: Negative for cough and shortness of breath.   Cardiovascular: Positive for leg swelling.  Gastrointestinal: Negative for constipation and diarrhea.  Genitourinary: Negative for difficulty urinating.  Musculoskeletal: Positive for neck pain. Negative for back pain.  Skin: Positive for rash.  Allergic/Immunologic: Negative for food allergies.  Neurological: Positive for weakness and numbness.  Hematological: Bruises/bleeds easily.  Psychiatric/Behavioral: Positive for sleep disturbance.     Objective: Vital Signs: BP 108/76 (BP Location: Left Arm, Patient Position: Sitting, Cuff Size: Normal)   Pulse 71   Resp 16   Ht 4\' 9"  (1.448 m)   Wt 126 lb (57.2 kg)   BMI 27.27 kg/m   Physical Exam  Ortho Exam awake alert and oriented x3.  Comfortable sitting positive effusion right knee with medial lateral joint pain.  Mild patellar does walk with a slight limp on the right lower extremity.  No distal edema.  Good pulses.  Pain with range of motion of her right hip with internal and external rotation.  No ambulatory aid.  Straight leg  raise negative.  Appears to have increased valgus with weightbearing right knee  Specialty Comments:  No specialty comments available.  Imaging: No results found.   PMFS History: There are no active problems to display for this patient.  Past Medical History:  Diagnosis Date  . Diverticulosis   . Hiatal hernia   . HTN (hypertension)   . Hypercholesterolemia   . Hypothyroidism   . Melanoma ([place]) 1978   chemo  . Osteoporosis   . Panic attacks     History reviewed. No pertinent family history.  Past Surgical History:  Procedure Laterality Date  . OVARIAN CYST REMOVAL     x 2  .  TOTAL ABDOMINAL HYSTERECTOMY W/ BILATERAL SALPINGOOPHORECTOMY     Social History   Occupational History  . Not on file  Tobacco Use  . Smoking status: Current Some Day Smoker    Packs/day: 0.10    Years: 35.00    Pack years: 3.50    Types: Cigarettes    Last attempt to quit: 07/24/2008    Years since quitting: 9.2  . Smokeless tobacco: Never Used  Substance and Sexual Activity  . Alcohol use: No  . Drug use: No  . Sexual activity: Not on file

## 2017-11-20 ENCOUNTER — Ambulatory Visit (INDEPENDENT_AMBULATORY_CARE_PROVIDER_SITE_OTHER): Payer: Medicare Other | Admitting: Orthopaedic Surgery

## 2017-11-20 ENCOUNTER — Encounter (INDEPENDENT_AMBULATORY_CARE_PROVIDER_SITE_OTHER): Payer: Self-pay | Admitting: Orthopaedic Surgery

## 2017-11-20 ENCOUNTER — Other Ambulatory Visit (INDEPENDENT_AMBULATORY_CARE_PROVIDER_SITE_OTHER): Payer: Self-pay | Admitting: Radiology

## 2017-11-20 VITALS — BP 80/60 | HR 70 | Resp 18 | Ht <= 58 in | Wt 124.0 lb

## 2017-11-20 DIAGNOSIS — M1611 Unilateral primary osteoarthritis, right hip: Secondary | ICD-10-CM | POA: Diagnosis not present

## 2017-11-20 DIAGNOSIS — M25551 Pain in right hip: Secondary | ICD-10-CM

## 2017-11-20 NOTE — Progress Notes (Signed)
Office Visit Note   Patient: Karina [name]           Date of Birth: May 21, 1941           MRN: [id number] Visit Date: 11/20/2017              Requested by: Prince Solian, MD [address] [place], [place] [address] PCP: Prince Solian, MD   Assessment & Plan: Visit Diagnoses:  1. Primary osteoarthritis of right hip     Plan:  #1: At this time our plan is to proceed with an corticosteroid injection to the right hip.  Hopefully we will see how this improves her symptoms from her groin pain and down into the knee.  Follow-Up Instructions: No follow-ups on file.   Orders:  No orders of the defined types were placed in this encounter.  No orders of the defined types were placed in this encounter.     Procedures: No procedures performed   Clinical Data: No additional findings.   Subjective: Chief Complaint  Patient presents with  . Right Knee - Follow-up  . Follow-up    R KNEE PAIN 2 WK F/U WORKED FOR A FEW DAYS THEN THE PAIN CAME BACK, PAIN IN HIP AREA    HPI  [name] is seen today for evaluation of her right leg pain.  She was last seen on November 05, 2017 by Dr. Durward Fortes with a 27-month history of right knee pain with radiating from her right hip.  This is started just before Christmas.  She denied any history of injury or trauma.  At times has had some swelling in the right knee as well as some aching and stiffness.  She has had difficulty bearing weight on occasions particularly when it is swollen.  She also today complains of more groin pain and pain down the anterior thigh to the knee.  Denies any numbness or tingling.  Review of Systems  Constitutional: Positive for fatigue. Negative for fever.  HENT: Positive for ear pain.   Eyes: Negative for pain.  Respiratory: Negative for cough and shortness of breath.   Cardiovascular: Positive for leg swelling.  Gastrointestinal: Negative for constipation and diarrhea.  Genitourinary: Negative for difficulty urinating.    Musculoskeletal: Positive for back pain and neck pain.  Skin: Negative for rash.  Allergic/Immunologic: Negative for food allergies.  Hematological: Bruises/bleeds easily.  Psychiatric/Behavioral: Positive for sleep disturbance.     Objective: Vital Signs: BP (!) 80/60 (BP Location: Left Arm, Patient Position: Sitting, Cuff Size: Normal)   Pulse 70   Resp 18   Ht 4\' 9"  (1.448 m)   Wt 124 lb (56.2 kg)   BMI 26.83 kg/m   Physical Exam  Constitutional: She is oriented to person, place, and time. She appears well-developed and well-nourished.  HENT:  Mouth/Throat: Oropharynx is clear and moist.  Eyes: Pupils are equal, round, and reactive to light. EOM are normal.  Pulmonary/Chest: Effort normal.  Neurological: She is alert and oriented to person, place, and time.  Skin: Skin is warm and dry.  Psychiatric: She has a normal mood and affect. Her behavior is normal.    Ortho Exam  Today examination of the right knee reveals minimal effusion.  No warmth or erythema.  She does have been the right hip limited range of motion in comparison to the left hip.  She does have pain at the extremes.  She certainly is limited in external rotation more than internal rotation in comparison to the left hip.  Negative straight leg raise at 90 degrees.  Specialty Comments:  No specialty comments available.  Imaging: No results found.   PMFS History: Current Outpatient Medications  Medication Sig Dispense Refill  . acetaminophen (TYLENOL) 325 MG tablet Take 325 mg by mouth every 6 (six) hours as needed for moderate pain.    Marland Kitchen ALPRAZolam (XANAX) 0.5 MG tablet Take 0.5 mg by mouth 2 (two) times daily as needed.    Marland Kitchen aspirin EC 81 MG tablet Take 81 mg by mouth daily.    Marland Kitchen atenolol (TENORMIN) 50 MG tablet Take 50 mg by mouth daily.    . celecoxib (CELEBREX) 200 MG capsule Take 200 mg by mouth daily as needed. For neuropathy    . Meth-Hyo-M Bl-Na Phos-Ph Sal (URIBEL) 118 MG CAPS Take 118 mg by  mouth daily as needed (bladder).    Marland Kitchen olmesartan-hydrochlorothiazide (BENICAR HCT) 20-12.5 MG tablet Take 1 tablet by mouth daily.    . risedronate (ACTONEL) 150 MG tablet Take 150 mg by mouth every 30 (thirty) days.    Marland Kitchen sertraline (ZOLOFT) 100 MG tablet Take 100 mg by mouth every morning.    Marland Kitchen SYNTHROID 75 MCG tablet Take 75 mcg by mouth daily before breakfast.    . traMADol (ULTRAM) 50 MG tablet Take 50 mg by mouth every 8 (eight) hours as needed.  1  . ZETIA 10 MG tablet Take 10 mg by mouth every evening.    Marland Kitchen HYDROcodone-acetaminophen (NORCO/VICODIN) 5-325 MG tablet Take 1-2 tablets by mouth every 4 (four) hours as needed for moderate pain or severe pain. (Patient not taking: Reported on 11/05/2017) 20 tablet 0  . lisinopril-hydrochlorothiazide (PRINZIDE,ZESTORETIC) 20-25 MG tablet Take 1 tablet by mouth daily.    Marland Kitchen oxyCODONE-acetaminophen (PERCOCET/ROXICET) 5-325 MG tablet Take 1 tablet by mouth every 6 (six) hours as needed for severe pain. (Patient not taking: Reported on 11/05/2017) 15 tablet 0   No current facility-administered medications for this visit.     There are no active problems to display for this patient.  Past Medical History:  Diagnosis Date  . Diverticulosis   . Hiatal hernia   . HTN (hypertension)   . Hypercholesterolemia   . Hypothyroidism   . Melanoma ([place]) 1978   chemo  . Osteoporosis   . Panic attacks     History reviewed. No pertinent family history.  Past Surgical History:  Procedure Laterality Date  . APPENDECTOMY    . BASAL CELL CARCINOMA EXCISION    . OVARIAN CYST REMOVAL     x 2  . SHOULDER SURGERY    . THYROIDECTOMY, PARTIAL    . TOTAL ABDOMINAL HYSTERECTOMY W/ BILATERAL SALPINGOOPHORECTOMY     Social History   Occupational History  . Not on file  Tobacco Use  . Smoking status: Current Some Day Smoker    Packs/day: 0.10    Years: 35.00    Pack years: 3.50    Types: Cigarettes    Last attempt to quit: 07/24/2008    Years since quitting:  9.3  . Smokeless tobacco: Never Used  Substance and Sexual Activity  . Alcohol use: No  . Drug use: No  . Sexual activity: Not on file

## 2017-12-10 ENCOUNTER — Ambulatory Visit (INDEPENDENT_AMBULATORY_CARE_PROVIDER_SITE_OTHER): Payer: Medicare Other | Admitting: Physical Medicine and Rehabilitation

## 2017-12-10 ENCOUNTER — Encounter (INDEPENDENT_AMBULATORY_CARE_PROVIDER_SITE_OTHER): Payer: Self-pay | Admitting: Physical Medicine and Rehabilitation

## 2017-12-10 ENCOUNTER — Ambulatory Visit (INDEPENDENT_AMBULATORY_CARE_PROVIDER_SITE_OTHER): Payer: Medicare Other

## 2017-12-10 DIAGNOSIS — M25551 Pain in right hip: Secondary | ICD-10-CM | POA: Diagnosis not present

## 2017-12-10 MED ORDER — BUPIVACAINE HCL 0.5 % IJ SOLN
3.0000 mL | INTRAMUSCULAR | Status: AC | PRN
  Administered 2017-12-10: 3 mL via INTRA_ARTICULAR

## 2017-12-10 MED ORDER — TRIAMCINOLONE ACETONIDE 40 MG/ML IJ SUSP
80.0000 mg | INTRAMUSCULAR | Status: AC | PRN
  Administered 2017-12-10: 80 mg via INTRA_ARTICULAR

## 2017-12-10 NOTE — Patient Instructions (Signed)

## 2017-12-10 NOTE — Progress Notes (Signed)
 .  Numeric Pain Rating Scale and Functional Assessment Average Pain 9   In the last MONTH (on 0-10 scale) has pain interfered with the following?  1. General activity like being  able to carry out your everyday physical activities such as walking, climbing stairs, carrying groceries, or moving a chair?  Rating(7)    -Dye Allergies.  

## 2017-12-10 NOTE — Progress Notes (Signed)
Karina [name] - 77 y.o. female MRN [id number]  Date of birth: [birth year]  Office Visit Note: Visit Date: 12/10/2017 PCP: Prince Solian, MD Referred by: Prince Solian, MD  Subjective: Chief Complaint  Patient presents with  . Right Hip - Pain   HPI: [name] is a 77 year old female who comes in today at the request of Dr. [name] for diagnostic and therapeutic anesthetic hip arthrogram on the right.  She is been having right hip pain and some groin pain with some referral to the knee.  She is a chronic knee pain as well.   ROS Otherwise per HPI.  Assessment & Plan: Visit Diagnoses:  1. Pain in right hip     Plan: Findings:  Diagnostic left therapeutic anesthetic hip arthrogram on the right.  Patient did have some relief of her symptoms in the anesthetic phase and had increased range of motion.    Meds & Orders: No orders of the defined types were placed in this encounter.   Orders Placed This Encounter  Procedures  . Large Joint Inj: R hip joint  . XR C-ARM NO REPORT    Follow-up: Return if symptoms worsen or fail to improve, for Dr. Durward Fortes.   Procedures: Large Joint Inj: R hip joint on 12/10/2017 8:50 AM Indications: pain and diagnostic evaluation Details: 22 G needle, anterior approach  Arthrogram: Yes  Medications: 80 mg triamcinolone acetonide 40 MG/ML; 3 mL bupivacaine 0.5 % Outcome: tolerated well, no immediate complications  Arthrogram demonstrated excellent flow of contrast throughout the joint surface without extravasation or obvious defect.  The patient had relief of symptoms during the anesthetic phase of the injection.  Procedure, treatment alternatives, risks and benefits explained, specific risks discussed. Consent was given by the patient. Immediately prior to procedure a time out was called to verify the correct patient, procedure, equipment, support staff and site/side marked as required. Patient was prepped and draped in the usual  sterile fashion.      No notes on file   Clinical History: No specialty comments available.   She reports that she has been smoking cigarettes.  She has a 3.50 pack-year smoking history. She has never used smokeless tobacco. No results for input(s): HGBA1C, LABURIC in the last 8760 hours.  Objective:  VS:  HT:    WT:   BMI:     BP:   HR: bpm  TEMP: ( )  RESP:  Physical Exam  Ortho Exam Imaging: Xr C-arm No Report  Result Date: 12/10/2017 Please see Notes or Procedures tab for imaging impression.   Past Medical/Family/Surgical/Social History: Medications & Allergies reviewed per EMR, new medications updated. There are no active problems to display for this patient.  Past Medical History:  Diagnosis Date  . Diverticulosis   . Hiatal hernia   . HTN (hypertension)   . Hypercholesterolemia   . Hypothyroidism   . Melanoma ([place]) 1978   chemo  . Osteoporosis   . Panic attacks    History reviewed. No pertinent family history. Past Surgical History:  Procedure Laterality Date  . APPENDECTOMY    . BASAL CELL CARCINOMA EXCISION    . OVARIAN CYST REMOVAL     x 2  . SHOULDER SURGERY    . THYROIDECTOMY, PARTIAL    . TOTAL ABDOMINAL HYSTERECTOMY W/ BILATERAL SALPINGOOPHORECTOMY     Social History   Occupational History  . Not on file  Tobacco Use  . Smoking status: Current Some Day Smoker    Packs/day: 0.10  Years: 35.00    Pack years: 3.50    Types: Cigarettes    Last attempt to quit: 07/24/2008    Years since quitting: 9.3  . Smokeless tobacco: Never Used  Substance and Sexual Activity  . Alcohol use: No  . Drug use: No  . Sexual activity: Not on file

## 2018-01-15 ENCOUNTER — Other Ambulatory Visit: Payer: Self-pay | Admitting: Surgery

## 2018-01-15 DIAGNOSIS — M7989 Other specified soft tissue disorders: Secondary | ICD-10-CM

## 2018-01-22 ENCOUNTER — Ambulatory Visit
Admission: RE | Admit: 2018-01-22 | Discharge: 2018-01-22 | Disposition: A | Discharge status: Home / Self Care | Payer: Medicare Other | Source: Ambulatory Visit | Attending: Surgery | Admitting: Surgery

## 2018-01-22 DIAGNOSIS — M7989 Other specified soft tissue disorders: Secondary | ICD-10-CM

## 2018-01-22 MED ORDER — GADOBENATE DIMEGLUMINE 529 MG/ML IV SOLN
10.0000 mL | Freq: Once | INTRAVENOUS | Status: AC | PRN
  Administered 2018-01-22: 10 mL via INTRAVENOUS

## 2018-02-06 ENCOUNTER — Telehealth (INDEPENDENT_AMBULATORY_CARE_PROVIDER_SITE_OTHER): Payer: Self-pay | Admitting: Physical Medicine and Rehabilitation

## 2018-02-06 NOTE — Telephone Encounter (Signed)
If it helped "a lot" then ok to repeat but would need Dr. Durward Fortes f/up if not helping long

## 2018-02-07 ENCOUNTER — Telehealth (INDEPENDENT_AMBULATORY_CARE_PROVIDER_SITE_OTHER): Payer: Self-pay | Admitting: Orthopaedic Surgery

## 2018-02-07 NOTE — Telephone Encounter (Signed)
Patient called stating that she scheduled an appointment for another shot in her hip with Dr. Ernestina Patches on 02/25/18.  Patient is checking to see if Dr. Durward Fortes wants to see her before that appointment.

## 2018-02-07 NOTE — Telephone Encounter (Signed)
Pt states last injection gave her 80-90% relief, pt is scheduled 8/5

## 2018-02-08 NOTE — Telephone Encounter (Signed)
I called patient 

## 2018-02-08 NOTE — Telephone Encounter (Signed)
Not necessary.

## 2018-02-08 NOTE — Telephone Encounter (Signed)
Please advise 

## 2018-02-25 ENCOUNTER — Ambulatory Visit (INDEPENDENT_AMBULATORY_CARE_PROVIDER_SITE_OTHER): Payer: Self-pay

## 2018-02-25 ENCOUNTER — Encounter (INDEPENDENT_AMBULATORY_CARE_PROVIDER_SITE_OTHER): Payer: Self-pay | Admitting: Physical Medicine and Rehabilitation

## 2018-02-25 ENCOUNTER — Ambulatory Visit (INDEPENDENT_AMBULATORY_CARE_PROVIDER_SITE_OTHER): Payer: Medicare Other | Admitting: Physical Medicine and Rehabilitation

## 2018-02-25 DIAGNOSIS — M25551 Pain in right hip: Secondary | ICD-10-CM | POA: Diagnosis not present

## 2018-02-25 NOTE — Progress Notes (Signed)
 .  Numeric Pain Rating Scale and Functional Assessment Average Pain 8   In the last MONTH (on 0-10 scale) has pain interfered with the following?  1. General activity like being  able to carry out your everyday physical activities such as walking, climbing stairs, carrying groceries, or moving a chair?  Rating(8)    -Dye Allergies.  

## 2018-02-25 NOTE — Progress Notes (Signed)
[name] - 77 y.o. female MRN [id number]  Date of birth: August 09, 1940  Office Visit Note: Visit Date: 02/25/2018 PCP: Prince Solian, MD Referred by: Prince Solian, MD  Subjective: Chief Complaint  Patient presents with  . Right Hip - Pain   HPI: [name] is a 77 year old female that we last saw in May and completed intra-articular hip injection that gave her 90% relief of her hip pain.  She has pain in the right hip and groin.  She comes in today at the request of Dr. [name] for repeat injection.  We will repeat diagnostic of therapeutic anesthetic hip arthrogram on the right.  Patient's average pain is 8 out of 10.  She reports that walking is really a big problem and Tylenol helps a little bit.   ROS Otherwise per HPI.  Assessment & Plan: Visit Diagnoses:  1. Pain in right hip     Plan: Findings:  Diagnostic and therapeutic right anesthetic hip arthrogram on the right.  Patient has significant relief during the anesthetic portion of the injection.    Meds & Orders: No orders of the defined types were placed in this encounter.   Orders Placed This Encounter  Procedures  . Large Joint Inj: R hip joint  . XR C-ARM NO REPORT    Follow-up: Return for Dr. Cyndee Brightly.   Procedures: Large Joint Inj: R hip joint on 02/25/2018 2:23 PM Indications: pain and diagnostic evaluation Details: 22 G needle, anterior approach  Arthrogram: Yes  Medications: 3 mL bupivacaine 0.5 %; 80 mg triamcinolone acetonide 40 MG/ML Outcome: tolerated well, no immediate complications  Arthrogram demonstrated excellent flow of contrast throughout the joint surface without extravasation or obvious defect.  The patient had relief of symptoms during the anesthetic phase of the injection.  Procedure, treatment alternatives, risks and benefits explained, specific risks discussed. Consent was given by the patient. Immediately prior to procedure a time out was called to verify the  correct patient, procedure, equipment, support staff and site/side marked as required. Patient was prepped and draped in the usual sterile fashion.      No notes on file   Clinical History: No specialty comments available.   She reports that she has been smoking cigarettes. She has a 3.50 pack-year smoking history. She has never used smokeless tobacco. No results for input(s): HGBA1C, LABURIC in the last 8760 hours.  Objective:  VS:  HT:    WT:   BMI:     BP:   HR: bpm  TEMP: ( )  RESP:  Physical Exam  Ortho Exam Imaging: No results found.  Past Medical/Family/Surgical/Social History: Medications & Allergies reviewed per EMR, new medications updated. Patient Active Problem List   Diagnosis Date Noted  . Unilateral primary osteoarthritis, right hip 03/11/2018   Past Medical History:  Diagnosis Date  . Diverticulosis   . Hiatal hernia   . HTN (hypertension)   . Hypercholesterolemia   . Hypothyroidism   . Melanoma ([place]) 1978   chemo  . Osteoporosis   . Panic attacks    History reviewed. No pertinent family history. Past Surgical History:  Procedure Laterality Date  . APPENDECTOMY    . BASAL CELL CARCINOMA EXCISION    . OVARIAN CYST REMOVAL     x 2  . SHOULDER SURGERY    . THYROIDECTOMY, PARTIAL    . TOTAL ABDOMINAL HYSTERECTOMY W/ BILATERAL SALPINGOOPHORECTOMY     Social History   Occupational History  . Not on file  Tobacco  Use  . Smoking status: Current Some Day Smoker    Packs/day: 0.10    Years: 35.00    Pack years: 3.50    Types: Cigarettes    Last attempt to quit: 07/24/2008    Years since quitting: 9.6  . Smokeless tobacco: Never Used  Substance and Sexual Activity  . Alcohol use: No  . Drug use: No  . Sexual activity: Not on file

## 2018-02-25 NOTE — Patient Instructions (Signed)

## 2018-03-11 ENCOUNTER — Encounter (INDEPENDENT_AMBULATORY_CARE_PROVIDER_SITE_OTHER): Payer: Self-pay | Admitting: Orthopaedic Surgery

## 2018-03-11 ENCOUNTER — Ambulatory Visit (INDEPENDENT_AMBULATORY_CARE_PROVIDER_SITE_OTHER): Payer: Medicare Other | Admitting: Orthopaedic Surgery

## 2018-03-11 VITALS — BP 115/82 | HR 65 | Ht 59.0 in | Wt 127.0 lb

## 2018-03-11 DIAGNOSIS — M1611 Unilateral primary osteoarthritis, right hip: Secondary | ICD-10-CM | POA: Diagnosis not present

## 2018-03-11 NOTE — Progress Notes (Signed)
Office Visit Note   Patient: Karina [name]           Date of Birth: 1940-11-04           MRN: [id number] Visit Date: 03/11/2018              Requested by: Prince Solian, MD [address] [place], [place] [address] PCP: Prince Solian, MD   Assessment & Plan: Visit Diagnoses:  1. Unilateral primary osteoarthritis, right hip     Plan: [name] has had 2 recent intra-articular cortisone injections of her right hip.  The second performed 2 weeks ago "really helped".  Long discussion regarding treatment options of her hip.  This included exercises and medicines.  She is taking half a tramadol per her primary care physician "occasionally".  Also discussed total hip replacement which she is somewhat hesitant to consider at this point.  That would be the definitive treatment.  Office visit approximately 30 minutes regarding all of the above and 50% of the time in counseling  Follow-Up Instructions: Return if symptoms worsen or fail to improve.   Orders:  No orders of the defined types were placed in this encounter.  No orders of the defined types were placed in this encounter.     Procedures: No procedures performed   Clinical Data: No additional findings.   Subjective: Chief Complaint  Patient presents with  . Follow-up    R KNEE SWELLING AND PAIN. PT ALSO HAD R HIP INJECTION W [place], STILL HAS SOME PAIN IN R GROIN AREA AND FEELS [place] A CATCH.  Karina [name] has been diagnosed with primary osteoarthritis of her right hip.  She is had several cortisone injections performed by Dr. Ernestina Patches.  The last was 2 weeks ago.  This made a "difference".  She is not as uncomfortable.  She still has some minor compromise of her activities but at least is able to sleep.  She is taking a half a tramadol at night per her primary care physician.  She will take an occasional Tylenol as well.  She is having pain in the groin with referred discomfort along the anterior thigh into her knee.  She has  not had any numbness or tingling  HPI  Review of Systems  Constitutional: Positive for fatigue. Negative for fever.  HENT: Negative for ear pain.   Eyes: Negative for pain.  Respiratory: Negative for cough and shortness of breath.   Cardiovascular: Positive for leg swelling.  Gastrointestinal: Negative for constipation and diarrhea.  Genitourinary: Negative for difficulty urinating.  Musculoskeletal: Positive for back pain. Negative for neck pain.  Skin: Negative for rash.  Allergic/Immunologic: Negative for food allergies.  Neurological: Positive for weakness. Negative for numbness.  Hematological: Bruises/bleeds easily.  Psychiatric/Behavioral: Positive for sleep disturbance.     Objective: Vital Signs: BP 115/82 (BP Location: Left Arm, Patient Position: Sitting, Cuff Size: Normal)   Pulse 65   Ht 4\' 11"  (1.499 m)   Wt 127 lb (57.6 kg)   BMI 25.65 kg/m   Physical Exam  Constitutional: She is oriented to person, place, and time. She appears well-developed and well-nourished.  HENT:  Mouth/Throat: Oropharynx is clear and moist.  Eyes: Pupils are equal, round, and reactive to light. EOM are normal.  Pulmonary/Chest: Effort normal.  Neurological: She is alert and oriented to person, place, and time.  Skin: Skin is warm and dry.  Psychiatric: She has a normal mood and affect. Her behavior is normal.    Ortho Exam awake  and alert and oriented x3.  Comfortable sitting.  Does have a mild limp with with ambulation referable to her right hip.  Definite decrease internal/external rotation of her right hip compared to the left.  She is having groin pain and anterior thigh discomfort.  Straight leg raise is negative.  Pelvis is level.  Neurovascular exam intact  Specialty Comments:  No specialty comments available.  Imaging: No results found.   PMFS History: Patient Active Problem List   Diagnosis Date Noted  . Unilateral primary osteoarthritis, right hip 03/11/2018   Past  Medical History:  Diagnosis Date  . Diverticulosis   . Hiatal hernia   . HTN (hypertension)   . Hypercholesterolemia   . Hypothyroidism   . Melanoma ([place]) 1978   chemo  . Osteoporosis   . Panic attacks     History reviewed. No pertinent family history.  Past Surgical History:  Procedure Laterality Date  . APPENDECTOMY    . BASAL CELL CARCINOMA EXCISION    . OVARIAN CYST REMOVAL     x 2  . SHOULDER SURGERY    . THYROIDECTOMY, PARTIAL    . TOTAL ABDOMINAL HYSTERECTOMY W/ BILATERAL SALPINGOOPHORECTOMY     Social History   Occupational History  . Not on file  Tobacco Use  . Smoking status: Current Some Day Smoker    Packs/day: 0.10    Years: 35.00    Pack years: 3.50    Types: Cigarettes    Last attempt to quit: 07/24/2008    Years since quitting: 9.6  . Smokeless tobacco: Never Used  Substance and Sexual Activity  . Alcohol use: No  . Drug use: No  . Sexual activity: Not on file

## 2018-03-13 MED ORDER — TRIAMCINOLONE ACETONIDE 40 MG/ML IJ SUSP
80.0000 mg | INTRAMUSCULAR | Status: AC | PRN
  Administered 2018-02-25: 80 mg via INTRA_ARTICULAR

## 2018-03-13 MED ORDER — BUPIVACAINE HCL 0.5 % IJ SOLN
3.0000 mL | INTRAMUSCULAR | Status: AC | PRN
  Administered 2018-02-25: 3 mL via INTRA_ARTICULAR

## 2018-03-28 ENCOUNTER — Encounter (INDEPENDENT_AMBULATORY_CARE_PROVIDER_SITE_OTHER): Payer: Self-pay | Admitting: Orthopaedic Surgery

## 2018-03-28 ENCOUNTER — Ambulatory Visit (INDEPENDENT_AMBULATORY_CARE_PROVIDER_SITE_OTHER): Payer: Medicare Other | Admitting: Orthopaedic Surgery

## 2018-03-28 VITALS — BP 99/78 | HR 84 | Ht 59.0 in | Wt 127.0 lb

## 2018-03-28 DIAGNOSIS — M1611 Unilateral primary osteoarthritis, right hip: Secondary | ICD-10-CM | POA: Diagnosis not present

## 2018-03-28 NOTE — Progress Notes (Signed)
Office Visit Note   Patient: Karina [name]           Date of Birth: [birth year]           MRN: [id number] Visit Date: 03/28/2018              Requested by: Prince Solian, MD [address] [place], [place] [address] PCP: Prince Solian, MD   Assessment & Plan: Visit Diagnoses:  1. Unilateral primary osteoarthritis, right hip     Plan: Karina [name] would like to proceed with a right hip replacement.  We have discussed the osteoarthritis in her hip over a long period of time.  She recently had a cortisone injection at the end of August that really was not very helpful.  Necessary and trying to take medicine to help her sleep after much discussion we will plan to proceed with surgery sometime at the end of October.  I discussed the surgery of the incision expected postoperative course and pain relief.  Total time for 30 minutes regarding her present problem 2% of the time in counseling  Follow-Up Instructions: Return will schedule right hip replacement.   Orders:  No orders of the defined types were placed in this encounter.  No orders of the defined types were placed in this encounter.     Procedures: No procedures performed   Clinical Data: No additional findings.   Subjective: Chief Complaint  Patient presents with  . Follow-up    R HIP PAIN FOR 9 MO. HAD 2 INJECTIONS, LAST ONE WAS 02/2018 FROM DR Ernestina Patches, NOW HAVING TROUBLE WALKING WITH A CANE  Karina [name] has had a chronic problem with her right hip over a long period of time.  She has evidence of osteoarthritis over the right hip and has had 2 cortisone injections the last of which was about 3 weeks ago.  She has not had any significant relief of her pain.  Is reached a point where she has compromise of her daily activities and sleep and wishes to proceed with hip replacement  HPI  Review of Systems  Constitutional: Positive for fatigue. Negative for fever.  HENT: Negative for ear pain.   Eyes: Negative for pain.    Respiratory: Negative for cough and shortness of breath.   Cardiovascular: Positive for leg swelling.  Gastrointestinal: Negative for constipation and diarrhea.  Genitourinary: Negative for difficulty urinating.  Musculoskeletal: Positive for back pain. Negative for neck pain.  Skin: Negative for rash.  Neurological: Positive for weakness and numbness.  Hematological: Bruises/bleeds easily.  Psychiatric/Behavioral: Positive for sleep disturbance.     Objective: Vital Signs: BP 99/78 (BP Location: Left Arm, Patient Position: Sitting, Cuff Size: Normal)   Pulse 84   Ht 4\' 11"  (1.499 m)   Wt 127 lb (57.6 kg)   BMI 25.65 kg/m   Physical Exam  Constitutional: She is oriented to person, place, and time. She appears well-developed and well-nourished.  HENT:  Mouth/Throat: Oropharynx is clear and moist.  Eyes: Pupils are equal, round, and reactive to light. EOM are normal.  Pulmonary/Chest: Effort normal.  Neurological: She is alert and oriented to person, place, and time.  Skin: Skin is warm and dry.  Psychiatric: She has a normal mood and affect. Her behavior is normal.    Ortho Exam awake alert and oriented x3.  Comfortable sitting but definitely has some discomfort when she walks referable to her right hip.  She does walk with an externally rotated right lower extremity.  Leg lengths appear  to be symmetrical.  Neurovascular exam intact.  No back pain.  Considerable pain with internal/external rotation  Specialty Comments:  No specialty comments available.  Imaging: No results found.   PMFS History: Patient Active Problem List   Diagnosis Date Noted  . Unilateral primary osteoarthritis, right hip 03/11/2018   Past Medical History:  Diagnosis Date  . Diverticulosis   . Hiatal hernia   . HTN (hypertension)   . Hypercholesterolemia   . Hypothyroidism   . Melanoma ([place]) 1978   chemo  . Osteoporosis   . Panic attacks     History reviewed. No pertinent family history.   Past Surgical History:  Procedure Laterality Date  . APPENDECTOMY    . BASAL CELL CARCINOMA EXCISION    . OVARIAN CYST REMOVAL     x 2  . SHOULDER SURGERY    . THYROIDECTOMY, PARTIAL    . TOTAL ABDOMINAL HYSTERECTOMY W/ BILATERAL SALPINGOOPHORECTOMY     Social History   Occupational History  . Not on file  Tobacco Use  . Smoking status: Current Some Day Smoker    Packs/day: 0.10    Years: 35.00    Pack years: 3.50    Types: Cigarettes    Last attempt to quit: 07/24/2008    Years since quitting: 9.6  . Smokeless tobacco: Never Used  Substance and Sexual Activity  . Alcohol use: No  . Drug use: No  . Sexual activity: Not on file

## 2018-04-09 ENCOUNTER — Encounter: Payer: Self-pay | Admitting: Orthopaedic Surgery

## 2018-04-09 ENCOUNTER — Telehealth (INDEPENDENT_AMBULATORY_CARE_PROVIDER_SITE_OTHER): Payer: Self-pay | Admitting: Orthopaedic Surgery

## 2018-04-09 NOTE — Telephone Encounter (Signed)
Patient called stating she is ready to schedule her right hip replacement surgery.  Patient states she saw Dr. Dagmar Hait, PCP and had cardiogram, chest x-ray, and bloodwork in preparation for her surgery.  Patient requested a return call next week.

## 2018-04-09 NOTE — Telephone Encounter (Signed)
Please advise 

## 2018-04-10 NOTE — Telephone Encounter (Signed)
Please advise. Thank you

## 2018-04-10 NOTE — Telephone Encounter (Signed)
Check to see if clearance forms have been returned

## 2018-04-16 ENCOUNTER — Other Ambulatory Visit (INDEPENDENT_AMBULATORY_CARE_PROVIDER_SITE_OTHER): Payer: Self-pay

## 2018-04-24 ENCOUNTER — Encounter (INDEPENDENT_AMBULATORY_CARE_PROVIDER_SITE_OTHER): Payer: Self-pay | Admitting: Orthopedic Surgery

## 2018-04-24 ENCOUNTER — Ambulatory Visit (INDEPENDENT_AMBULATORY_CARE_PROVIDER_SITE_OTHER): Payer: Medicare Other | Admitting: Orthopedic Surgery

## 2018-04-24 VITALS — BP 99/62 | HR 64 | Temp 98.7°F | Resp 16 | Ht 60.0 in | Wt 130.4 lb

## 2018-04-24 DIAGNOSIS — M1611 Unilateral primary osteoarthritis, right hip: Secondary | ICD-10-CM

## 2018-04-24 NOTE — H&P (Addendum)
TOTAL HIP ADMISSION H&P  Patient is admitted for bilaterally total hip arthroplasty.  Subjective:  Chief Complaint: right hip pain  HPI: Karina [name], 77 y.o. female, has a history of pain and functional disability in the right hip(s) due to arthritis and patient has failed non-surgical conservative treatments for greater than 12 weeks to include NSAID's and/or analgesics, corticosteriod injections, use of assistive devices, weight reduction as appropriate and activity modification.  Onset of symptoms was gradual starting 9 years ago with gradually worsening course since that time.The patient noted no past surgery on the right hip(s).  Patient currently rates pain in the right hip at 9 out of 10 with activity. Patient has night pain, worsening of pain with activity and weight bearing, trendelenberg gait, pain that interfers with activities of daily living, pain with passive range of motion and crepitus. Patient has evidence of subchondral cysts, subchondral sclerosis, periarticular osteophytes and joint space narrowing by imaging studies. This condition presents safety issues increasing the risk of falls. There is no current active infection.  Patient Active Problem List   Diagnosis Date Noted  . Unilateral primary osteoarthritis, right hip 03/11/2018   Past Medical History:  Diagnosis Date  . Diverticulosis   . Hiatal hernia   . HTN (hypertension)   . Hypercholesterolemia   . Hypothyroidism   . Melanoma ([place]) 1978   chemo  . Osteoporosis   . Panic attacks     Past Surgical History:  Procedure Laterality Date  . APPENDECTOMY    . BASAL CELL CARCINOMA EXCISION    . OVARIAN CYST REMOVAL     x 2  . SHOULDER SURGERY    . THYROIDECTOMY, PARTIAL    . TOTAL ABDOMINAL HYSTERECTOMY W/ BILATERAL SALPINGOOPHORECTOMY      No current facility-administered medications for this encounter.    Current Outpatient Medications  Medication Sig Dispense Refill Last Dose  . acetaminophen (TYLENOL)  325 MG tablet Take 325 mg by mouth every 6 (six) hours as needed for moderate pain or headache.    Taking  . ALPRAZolam (XANAX) 0.5 MG tablet Take 0.5 mg by mouth 2 (two) times daily as needed for anxiety.    Taking  . aspirin EC 81 MG tablet Take 81 mg by mouth daily.   Taking  . atenolol (TENORMIN) 50 MG tablet Take 50 mg by mouth daily.   Taking  . celecoxib (CELEBREX) 200 MG capsule Take 200 mg by mouth daily as needed for mild pain.    Taking  . ergocalciferol (VITAMIN D2) 50000 units capsule Take 50,000 Units by mouth 2 (two) times a week.   Taking  . Meth-Hyo-M Bl-Na Phos-Ph Sal (URIBEL) 118 MG CAPS Take 118 mg by mouth daily as needed (for bladder).    Taking  . olmesartan-hydrochlorothiazide (BENICAR HCT) 20-12.5 MG tablet Take 1 tablet by mouth daily.   Taking  . risedronate (ACTONEL) 150 MG tablet Take 150 mg by mouth every 30 (thirty) days.   Taking  . sertraline (ZOLOFT) 100 MG tablet Take 100 mg by mouth every morning.   Taking  . SYNTHROID 75 MCG tablet Take 75 mcg by mouth daily before breakfast.   Taking  . traMADol (ULTRAM) 50 MG tablet Take 50 mg by mouth every 8 (eight) hours as needed for moderate pain.   1 Taking  . ZETIA 10 MG tablet Take 10 mg by mouth at bedtime.    Taking  . calcium carbonate (CALCIUM 600) 600 MG TABS tablet Take by mouth.  Taking  . HYDROcodone-acetaminophen (NORCO/VICODIN) 5-325 MG tablet Take 1-2 tablets by mouth every 4 (four) hours as needed for moderate pain or severe pain. (Patient not taking: Reported on 04/19/2018) 20 tablet 0 Not Taking  . oxyCODONE-acetaminophen (PERCOCET/ROXICET) 5-325 MG tablet Take 1 tablet by mouth every 6 (six) hours as needed for severe pain. (Patient not taking: Reported on 04/19/2018) 15 tablet 0 Not Taking   Allergies  Allergen Reactions  . Morphine Itching  . Propoxyphene Hcl Itching    Social History   Tobacco Use  . Smoking status: Current Some Day Smoker    Packs/day: 0.10    Years: 35.00    Pack years:  3.50    Types: Cigarettes    Last attempt to quit: 07/24/2008    Years since quitting: 9.7  . Smokeless tobacco: Never Used  . Tobacco comment: 1 pack weekly  Substance Use Topics  . Alcohol use: No    No family history on file.   Review of Systems  Constitutional: Positive for fatigue.  HENT: Negative for trouble swallowing.   Eyes: Negative for pain.  Respiratory: Negative for shortness of breath.   Cardiovascular: Positive for leg swelling.  Gastrointestinal: Negative for constipation.  Endocrine: Positive for cold intolerance and heat intolerance.  Genitourinary: Negative for difficulty urinating.  Musculoskeletal: Positive for gait problem and joint swelling.  Skin: Positive for rash.  Allergic/Immunologic: Negative for food allergies.  Neurological: Positive for weakness and numbness.  Hematological: Does not bruise/bleed easily.  Psychiatric/Behavioral: Positive for sleep disturbance.   Objective:  Physical Exam  Constitutional: She is oriented to person, place, and time. She appears well-developed and well-nourished.  HENT:  Head: Atraumatic.  Eyes: Pupils are equal, round, and reactive to light. Conjunctivae and EOM are normal.  Neck: Neck supple. No thyromegaly present.  Cardiovascular: Normal rate, regular rhythm, normal heart sounds and intact distal pulses.  No murmur heard. Respiratory: Effort normal and breath sounds normal.  GI: Soft. Bowel sounds are normal.  Neurological: She is alert and oriented to person, place, and time.  Skin: Skin is warm and dry.  Psychiatric: She has a normal mood and affect. Her behavior is normal. Judgment and thought content normal.    Vital signs in last 24 hours: Temp:  [98.7 F (37.1 C)] 98.7 F (37.1 C) (10/02 1127) Pulse Rate:  [64] 64 (10/02 1127) Resp:  [16] 16 (10/02 1127) BP: (99)/(62) 99/62 (10/02 1127) Weight:  [59.1 kg] 59.1 kg (10/02 1127)  Labs:   Estimated body mass index is 25.47 kg/m as calculated  from the following:   Height as of 04/24/18: 5' (1.524 m).   Weight as of 04/24/18: 59.1 kg.   Imaging Review Plain radiographs demonstrate moderate degenerative joint disease of the right hip(s). The bone quality appears to be good for age and reported activity level.    Preoperative templating of the joint replacement has been completed, documented, and submitted to the Operating Room personnel in order to optimize intra-operative equipment management.     Assessment/Plan:  End stage arthritis, right hip(s)  The patient history, physical examination, clinical judgement of the provider and imaging studies are consistent with end stage degenerative joint disease of the right hip(s) and total hip arthroplasty is deemed medically necessary. The treatment options including medical management, injection therapy, arthroscopy and arthroplasty were discussed at length. The risks and benefits of total hip arthroplasty were presented and reviewed. The risks due to aseptic loosening, infection, stiffness, dislocation/subluxation,  thromboembolic complications and other  imponderables were discussed.  The patient acknowledged the explanation, agreed to proceed with the plan and consent was signed. Patient is being admitted for inpatient treatment for surgery, pain control, PT, OT, prophylactic antibiotics, VTE prophylaxis, progressive ambulation and ADL's and discharge planning.The patient is planning to be discharged to skilled nursing facility   [place] D. [place], [place] [phone]  04/24/2018 12:57 PM

## 2018-04-24 NOTE — Progress Notes (Signed)
Chief Complaint: right hip pain  HPI: Karina [name], 77 y.o. female, has a history of pain and functional disability in the right hip(s) due to arthritis and patient has failed non-surgical conservative treatments for greater than 12 weeks to include NSAID's and/or analgesics, corticosteriod injections, use of assistive devices, weight reduction as appropriate and activity modification.  Onset of symptoms was gradual starting 9 years ago with gradually worsening course since that time.The patient noted no past surgery on the right hip(s).  Patient currently rates pain in the right hip at 9 out of 10 with activity. Patient has night pain, worsening of pain with activity and weight bearing, trendelenberg gait, pain that interfers with activities of daily living, pain with passive range of motion and crepitus. Patient has evidence of subchondral cysts, subchondral sclerosis, periarticular osteophytes and joint space narrowing by imaging studies. This condition presents safety issues increasing the risk of falls. There is no current active infection.  Patient Active Problem List   Diagnosis Date Noted  . Unilateral primary osteoarthritis, right hip 03/11/2018   Past Medical History:  Diagnosis Date  . Diverticulosis   . Hiatal hernia   . HTN (hypertension)   . Hypercholesterolemia   . Hypothyroidism   . Melanoma ([place]) 1978   chemo  . Osteoporosis   . Panic attacks     Past Surgical History:  Procedure Laterality Date  . APPENDECTOMY    . BASAL CELL CARCINOMA EXCISION    . OVARIAN CYST REMOVAL     x 2  . SHOULDER SURGERY    . THYROIDECTOMY, PARTIAL    . TOTAL ABDOMINAL HYSTERECTOMY W/ BILATERAL SALPINGOOPHORECTOMY      No current facility-administered medications for this encounter.    Current Outpatient Medications  Medication Sig Dispense Refill Last Dose  . acetaminophen (TYLENOL) 325 MG tablet Take 325 mg by mouth every 6 (six) hours as needed for moderate pain or headache.    Taking    . ALPRAZolam (XANAX) 0.5 MG tablet Take 0.5 mg by mouth 2 (two) times daily as needed for anxiety.    Taking  . aspirin EC 81 MG tablet Take 81 mg by mouth daily.   Taking  . atenolol (TENORMIN) 50 MG tablet Take 50 mg by mouth daily.   Taking  . celecoxib (CELEBREX) 200 MG capsule Take 200 mg by mouth daily as needed for mild pain.    Taking  . ergocalciferol (VITAMIN D2) 50000 units capsule Take 50,000 Units by mouth 2 (two) times a week.   Taking  . Meth-Hyo-M Bl-Na Phos-Ph Sal (URIBEL) 118 MG CAPS Take 118 mg by mouth daily as needed (for bladder).    Taking  . olmesartan-hydrochlorothiazide (BENICAR HCT) 20-12.5 MG tablet Take 1 tablet by mouth daily.   Taking  . risedronate (ACTONEL) 150 MG tablet Take 150 mg by mouth every 30 (thirty) days.   Taking  . sertraline (ZOLOFT) 100 MG tablet Take 100 mg by mouth every morning.   Taking  . SYNTHROID 75 MCG tablet Take 75 mcg by mouth daily before breakfast.   Taking  . traMADol (ULTRAM) 50 MG tablet Take 50 mg by mouth every 8 (eight) hours as needed for moderate pain.   1 Taking  . ZETIA 10 MG tablet Take 10 mg by mouth at bedtime.    Taking  . calcium carbonate (CALCIUM 600) 600 MG TABS tablet Take by mouth.   Taking  . HYDROcodone-acetaminophen (NORCO/VICODIN) 5-325 MG tablet Take 1-2 tablets by mouth every 4 (  four) hours as needed for moderate pain or severe pain. (Patient not taking: Reported on 04/19/2018) 20 tablet 0 Not Taking  . oxyCODONE-acetaminophen (PERCOCET/ROXICET) 5-325 MG tablet Take 1 tablet by mouth every 6 (six) hours as needed for severe pain. (Patient not taking: Reported on 04/19/2018) 15 tablet 0 Not Taking   Allergies  Allergen Reactions  . Morphine Itching  . Propoxyphene Hcl Itching    Social History   Tobacco Use  . Smoking status: Current Some Day Smoker    Packs/day: 0.10    Years: 35.00    Pack years: 3.50    Types: Cigarettes    Last attempt to quit: 07/24/2008    Years since quitting: 9.7  . Smokeless  tobacco: Never Used  . Tobacco comment: 1 pack weekly  Substance Use Topics  . Alcohol use: No    No family history on file.   Review of Systems  Constitutional: Positive for fatigue.  HENT: Negative for trouble swallowing.   Eyes: Negative for pain.  Respiratory: Negative for shortness of breath.   Cardiovascular: Positive for leg swelling.  Gastrointestinal: Negative for constipation.  Endocrine: Positive for cold intolerance and heat intolerance.  Genitourinary: Negative for difficulty urinating.  Musculoskeletal: Positive for gait problem and joint swelling.  Skin: Positive for rash.  Allergic/Immunologic: Negative for food allergies.  Neurological: Positive for weakness and numbness.  Hematological: Does not bruise/bleed easily.  Psychiatric/Behavioral: Positive for sleep disturbance.   Objective:  Physical Exam  Constitutional: She is oriented to person, place, and time. She appears well-developed and well-nourished.  HENT:  Head: Atraumatic.  Eyes: Pupils are equal, round, and reactive to light. Conjunctivae and EOM are normal.  Neck: Neck supple. No thyromegaly present.  Cardiovascular: Normal rate, regular rhythm, normal heart sounds and intact distal pulses.  No murmur heard. Respiratory: Effort normal and breath sounds normal.  GI: Soft. Bowel sounds are normal.  Neurological: She is alert and oriented to person, place, and time.  Skin: Skin is warm and dry.  Psychiatric: She has a normal mood and affect. Her behavior is normal. Judgment and thought content normal.    Vital signs in last 24 hours: Temp:  [98.7 F (37.1 C)] 98.7 F (37.1 C) (10/02 1127) Pulse Rate:  [64] 64 (10/02 1127) Resp:  [16] 16 (10/02 1127) BP: (99)/(62) 99/62 (10/02 1127) Weight:  [59.1 kg] 59.1 kg (10/02 1127)  Labs:   Estimated body mass index is 25.47 kg/m as calculated from the following:   Height as of 04/24/18: 5' (1.524 m).   Weight as of 04/24/18: 59.1 kg.   Imaging  Review Plain radiographs demonstrate moderate degenerative joint disease of the right hip(s). The bone quality appears to be good for age and reported activity level.    Preoperative templating of the joint replacement has been completed, documented, and submitted to the Operating Room personnel in order to optimize intra-operative equipment management.     Assessment/Plan:  End stage arthritis, right hip(s)  The patient history, physical examination, clinical judgement of the provider and imaging studies are consistent with end stage degenerative joint disease of the right hip(s) and total hip arthroplasty is deemed medically necessary. The treatment options including medical management, injection therapy, arthroscopy and arthroplasty were discussed at length. The risks and benefits of total hip arthroplasty were presented and reviewed. The risks due to aseptic loosening, infection, stiffness, dislocation/subluxation,  thromboembolic complications and other imponderables were discussed.  The patient acknowledged the explanation, agreed to proceed with the plan  and consent was signed. Patient is being admitted for inpatient treatment for surgery, pain control, PT, OT, prophylactic antibiotics, VTE prophylaxis, progressive ambulation and ADL's and discharge planning.The patient is planning to be discharged to skilled nursing facility   [place] D. [place], [place] [phone]  04/24/2018 12:57 PM

## 2018-04-25 ENCOUNTER — Telehealth (INDEPENDENT_AMBULATORY_CARE_PROVIDER_SITE_OTHER): Payer: Self-pay | Admitting: Orthopaedic Surgery

## 2018-04-25 NOTE — Pre-Procedure Instructions (Signed)
Karina [name]  04/25/2018      [id number] Karina [name], [place] [address] Phone: [phone] Fax: [phone]    Your procedure is scheduled on Tuesday, October 15.  Report to [facility] Admitting at 5:30 A.M.  Call this number if you have problems the morning of surgery:  [phone]   Remember:  Do not eat or drink after midnight.    Take these medicines the morning of surgery with A SIP OF WATER  atenolol (TENORMIN) sertraline (ZOLOFT)  SYNTHROID Xanax-as needed Tylenol-as needed.   Follow your surgeon's instructions on when to stop Asprin.  If no instructions were given by your surgeon then you will need to call the office to get those instructions.     7 days prior to surgery STOP taking any Aspirin(unless otherwise instructed by your surgeon), Aleve, Celebrex, Naproxen, Ibuprofen, Motrin, Advil, Goody's, BC's, all herbal medications, fish oil, and all vitamins   Do not wear jewelry, make-up or nail polish.  Do not wear lotions, powders, or perfumes, or deodorant.  Do not shave 48 hours prior to surgery.    Do not bring valuables to the hospital.  [facility] is not responsible for any belongings or valuables.  Contacts, dentures or bridgework may not be worn into surgery.  Leave your suitcase in the car.  After surgery it may be brought to your room.  For patients admitted to the hospital, discharge time will be determined by your treatment team.  Patients discharged the day of surgery will not be allowed to drive home.   Special instructions:   [facility]- Preparing For Surgery  Before surgery, you can play an important role. Because skin is not sterile, your skin needs to be as free of germs as possible. You can reduce the number of germs on your skin by washing with CHG (chlorahexidine gluconate) Soap before surgery.  CHG is an antiseptic cleaner which kills germs and  bonds with the skin to continue killing germs even after washing.    Oral Hygiene is also important to reduce your risk of infection.  Remember - BRUSH YOUR TEETH THE MORNING OF SURGERY WITH YOUR REGULAR TOOTHPASTE  Please do not use if you have an allergy to CHG or antibacterial soaps. If your skin becomes reddened/irritated stop using the CHG.  Do not shave (including legs and underarms) for at least 48 hours prior to first CHG shower. It is OK to shave your face.  Please follow these instructions carefully.   1. Shower the NIGHT BEFORE SURGERY and the MORNING OF SURGERY with CHG.   2. If you chose to wash your hair, wash your hair first as usual with your normal shampoo.  3. After you shampoo, rinse your hair and body thoroughly to remove the shampoo.  4. Use CHG as you would any other liquid soap. You can apply CHG directly to the skin and wash gently with a scrungie or a clean washcloth.   5. Apply the CHG Soap to your body ONLY FROM THE NECK DOWN.  Do not use on open wounds or open sores. Avoid contact with your eyes, ears, mouth and genitals (private parts). Wash Face and genitals (private parts)  with your normal soap.  6. Wash thoroughly, paying special attention to the area where your surgery will be performed.  7. Thoroughly rinse your body with warm water from the neck down.  8. DO  NOT shower/wash with your normal soap after using and rinsing off the CHG Soap.  9. Pat yourself dry with a CLEAN TOWEL.  10. Wear CLEAN PAJAMAS to bed the night before surgery, wear comfortable clothes the morning of surgery  11. Place CLEAN SHEETS on your bed the night of your first shower and DO NOT SLEEP WITH PETS.    Day of Surgery:  Do not apply any deodorants/lotions.  Please wear clean clothes to the hospital/surgery center.   Remember to brush your teeth WITH YOUR REGULAR TOOTHPASTE.  Please read over the following fact sheets that you were given.

## 2018-04-25 NOTE — Telephone Encounter (Signed)
Please advise 

## 2018-04-25 NOTE — Telephone Encounter (Signed)
Darlene from [employer] at [facility] left a voicemail regarding patient's surgery scheduled for 10/15.  She states she called [facility] about authorization and checked online under your authorization screen and it shows she is approved for 10/8 and 10/9.   My records show that she is schedule for 10/15.  I don't see anything in the system that shows the date has been changed.  Please call back [phone]

## 2018-04-26 ENCOUNTER — Other Ambulatory Visit: Payer: Self-pay

## 2018-04-26 ENCOUNTER — Encounter (HOSPITAL_COMMUNITY)
Admission: RE | Admit: 2018-04-26 | Discharge: 2018-04-26 | Disposition: A | Discharge status: Home / Self Care | Payer: Medicare Other | Source: Ambulatory Visit | Attending: Orthopaedic Surgery | Admitting: Orthopaedic Surgery

## 2018-04-26 ENCOUNTER — Encounter (HOSPITAL_COMMUNITY): Payer: Self-pay

## 2018-04-26 DIAGNOSIS — Z01812 Encounter for preprocedural laboratory examination: Secondary | ICD-10-CM | POA: Diagnosis present

## 2018-04-26 HISTORY — DX: Family history of other specified conditions: Z84.89

## 2018-04-26 HISTORY — DX: Other specified postprocedural states: Z98.890

## 2018-04-26 HISTORY — DX: Adverse effect of unspecified anesthetic, initial encounter: T41.45XA

## 2018-04-26 HISTORY — DX: Gastro-esophageal reflux disease without esophagitis: K21.9

## 2018-04-26 HISTORY — DX: Other complications of anesthesia, initial encounter: T88.59XA

## 2018-04-26 HISTORY — DX: Nausea with vomiting, unspecified: R11.2

## 2018-04-26 LAB — COMPREHENSIVE METABOLIC PANEL
ALBUMIN: 3.7 g/dL (ref 3.5–5.0)
ALK PHOS: 76 U/L (ref 38–126)
ALT: 16 U/L (ref 0–44)
AST: 23 U/L (ref 15–41)
Anion gap: 9 (ref 5–15)
BILIRUBIN TOTAL: 0.5 mg/dL (ref 0.3–1.2)
BUN: 19 mg/dL (ref 8–23)
CALCIUM: 9.6 mg/dL (ref 8.9–10.3)
CO2: 24 mmol/L (ref 22–32)
CREATININE: 0.63 mg/dL (ref 0.44–1.00)
Chloride: 106 mmol/L (ref 98–111)
GFR calc Af Amer: >60 mL/min (ref >60)
GFR calc non Af Amer: >60 mL/min (ref >60)
GLUCOSE: 86 mg/dL (ref 70–99)
Potassium: 3.3 mmol/L — ABNORMAL LOW (ref 3.5–5.1)
Sodium: 139 mmol/L (ref 135–145)
TOTAL PROTEIN: 7.1 g/dL (ref 6.5–8.1)

## 2018-04-26 LAB — CBC WITH DIFFERENTIAL/PLATELET
Abs Immature Granulocytes: 0 10*3/uL (ref 0.0–0.1)
Basophils Absolute: 0 10*3/uL (ref 0.0–0.1)
Basophils Relative: 0 %
Eosinophils Absolute: 0.2 10*3/uL (ref 0.0–0.7)
Eosinophils Relative: 2 %
HEMATOCRIT: 40.8 % (ref 36.0–46.0)
HEMOGLOBIN: 12.7 g/dL (ref 12.0–15.0)
Immature Granulocytes: 0 %
LYMPHS ABS: 2.1 10*3/uL (ref 0.7–4.0)
LYMPHS PCT: 23 %
MCH: 29.1 pg (ref 26.0–34.0)
MCHC: 31.1 g/dL (ref 30.0–36.0)
MCV: 93.6 fL (ref 78.0–100.0)
Monocytes Absolute: 0.6 10*3/uL (ref 0.1–1.0)
Monocytes Relative: 7 %
Neutro Abs: 6.1 10*3/uL (ref 1.7–7.7)
Neutrophils Relative %: 68 %
Platelets: 225 10*3/uL (ref 150–400)
RBC: 4.36 MIL/uL (ref 3.87–5.11)
RDW: 13.6 % (ref 11.5–15.5)
WBC: 9 10*3/uL (ref 4.0–10.5)

## 2018-04-26 LAB — URINALYSIS, ROUTINE W REFLEX MICROSCOPIC
BACTERIA UA: NONE SEEN
Bilirubin Urine: NEGATIVE
GLUCOSE, UA: NEGATIVE mg/dL
KETONES UR: NEGATIVE mg/dL
Leukocytes, UA: NEGATIVE
Nitrite: NEGATIVE
PROTEIN: NEGATIVE mg/dL
Specific Gravity, Urine: 1.011 (ref 1.005–1.030)
pH: 5 (ref 5.0–8.0)

## 2018-04-26 LAB — ABO/RH: ABO/RH(D): A POS

## 2018-04-26 LAB — SURGICAL PCR SCREEN
MRSA, PCR: NEGATIVE
Staphylococcus aureus: POSITIVE — AB

## 2018-04-26 LAB — PROTIME-INR
INR: 1.01
Prothrombin Time: 13.2 seconds (ref 11.4–15.2)

## 2018-04-26 LAB — TYPE AND SCREEN
ABO/RH(D): A POS
Antibody Screen: NEGATIVE

## 2018-04-26 LAB — APTT: aPTT: 32 seconds (ref 24–36)

## 2018-04-26 NOTE — Progress Notes (Signed)
PCP - Ravisankar Avva Cardiologist - denies  Chest x-ray - 04/09/18 EKG - 04/09/18 Stress Test - 11/2009 ECHO - denies Cardiac Cath - denies  Sleep Study - denies  Aspirin Instructions: LD: 04/24/18  Anesthesia review: No  EKG, CXR, Stress test results, clearance forms and recent lab work was faxed over and are located in shadow chart.   Patient denies shortness of breath, fever, cough and chest pain at PAT appointment   Patient verbalized understanding of instructions that were given to them at the PAT appointment. Patient was also instructed that they will need to review over the PAT instructions again at home before surgery.

## 2018-04-27 LAB — URINE CULTURE
Culture: NO GROWTH
Special Requests: NORMAL

## 2018-05-06 MED ORDER — ACETAMINOPHEN 10 MG/ML IV SOLN: 1000.0000 mg | Freq: Once | INTRAVENOUS | Status: DC

## 2018-05-06 MED ORDER — TRANEXAMIC ACID 1000 MG/10ML IV SOLN
2000.0000 mg | INTRAVENOUS | Status: AC
  Administered 2018-05-07: 2000 mg via TOPICAL
  Filled 2018-05-06: qty 20

## 2018-05-06 NOTE — Anesthesia Preprocedure Evaluation (Addendum)
Anesthesia Evaluation  Patient identified by MRN, date of birth, ID band Patient awake    Reviewed: Allergy & Precautions, H&P , NPO status , Patient's Chart, lab work & pertinent test results, reviewed documented beta blocker date and time   History of Anesthesia Complications (+) PONV and Family history of anesthesia reaction  Airway Mallampati: II  TM Distance: >3 FB Neck ROM: Full    Dental no notable dental hx. (+) Edentulous Upper, Edentulous Lower, Dental Advisory Given   Pulmonary Current Smoker,    Pulmonary exam normal breath sounds clear to auscultation       Cardiovascular Exercise Tolerance: Good hypertension, Pt. on medications and Pt. on home beta blockers  Rhythm:Regular Rate:Normal     Neuro/Psych Anxiety negative neurological ROS     GI/Hepatic Neg liver ROS, hiatal hernia, GERD  Medicated,  Endo/Other  Hypothyroidism   Renal/GU negative Renal ROS  negative genitourinary   Musculoskeletal  (+) Arthritis , Osteoarthritis,    Abdominal   Peds  Hematology negative hematology ROS (+)   Anesthesia Other Findings   Reproductive/Obstetrics negative OB ROS                            Anesthesia Physical Anesthesia Plan  ASA: II  Anesthesia Plan: Spinal   Post-op Pain Management:    Induction: Intravenous  PONV Risk Score and Plan: 3 and Ondansetron, Propofol infusion and Dexamethasone  Airway Management Planned: Simple Face Mask  Additional Equipment:   Intra-op Plan:   Post-operative Plan:   Informed Consent: I have reviewed the patients History and Physical, chart, labs and discussed the procedure including the risks, benefits and alternatives for the proposed anesthesia with the patient or authorized representative who has indicated his/her understanding and acceptance.   Dental advisory given  Plan Discussed with: CRNA  Anesthesia Plan Comments:          Anesthesia Quick Evaluation

## 2018-05-07 ENCOUNTER — Encounter (HOSPITAL_COMMUNITY): Payer: Self-pay | Admitting: Certified Registered Nurse Anesthetist

## 2018-05-07 ENCOUNTER — Inpatient Hospital Stay (HOSPITAL_COMMUNITY): Payer: Medicare Other

## 2018-05-07 ENCOUNTER — Inpatient Hospital Stay (HOSPITAL_COMMUNITY): Payer: Medicare Other | Admitting: Anesthesiology

## 2018-05-07 ENCOUNTER — Inpatient Hospital Stay (HOSPITAL_COMMUNITY): Payer: Medicare Other | Admitting: Vascular Surgery

## 2018-05-07 ENCOUNTER — Other Ambulatory Visit: Payer: Self-pay

## 2018-05-07 ENCOUNTER — Encounter (HOSPITAL_COMMUNITY)
Admission: RE | Disposition: A | Discharge status: Home Health | Payer: Self-pay | Source: Ambulatory Visit | Attending: Orthopaedic Surgery

## 2018-05-07 ENCOUNTER — Inpatient Hospital Stay (HOSPITAL_COMMUNITY)
Admission: RE | Admit: 2018-05-07 | Discharge: 2018-05-09 | DRG: 470 | Disposition: A | Discharge status: Home Health | Payer: Medicare Other | Source: Ambulatory Visit | Attending: Orthopaedic Surgery | Admitting: Orthopaedic Surgery

## 2018-05-07 DIAGNOSIS — Z7982 Long term (current) use of aspirin: Secondary | ICD-10-CM | POA: Diagnosis not present

## 2018-05-07 DIAGNOSIS — I10 Essential (primary) hypertension: Secondary | ICD-10-CM | POA: Diagnosis present

## 2018-05-07 DIAGNOSIS — E039 Hypothyroidism, unspecified: Secondary | ICD-10-CM | POA: Diagnosis present

## 2018-05-07 DIAGNOSIS — F410 Panic disorder [episodic paroxysmal anxiety] without agoraphobia: Secondary | ICD-10-CM | POA: Diagnosis present

## 2018-05-07 DIAGNOSIS — M1611 Unilateral primary osteoarthritis, right hip: Principal | ICD-10-CM

## 2018-05-07 DIAGNOSIS — M810 Age-related osteoporosis without current pathological fracture: Secondary | ICD-10-CM | POA: Diagnosis present

## 2018-05-07 DIAGNOSIS — Z79890 Hormone replacement therapy (postmenopausal): Secondary | ICD-10-CM | POA: Diagnosis not present

## 2018-05-07 DIAGNOSIS — F17210 Nicotine dependence, cigarettes, uncomplicated: Secondary | ICD-10-CM | POA: Diagnosis present

## 2018-05-07 DIAGNOSIS — E7800 Pure hypercholesterolemia, unspecified: Secondary | ICD-10-CM | POA: Diagnosis present

## 2018-05-07 DIAGNOSIS — Z9889 Other specified postprocedural states: Secondary | ICD-10-CM

## 2018-05-07 DIAGNOSIS — K219 Gastro-esophageal reflux disease without esophagitis: Secondary | ICD-10-CM | POA: Diagnosis present

## 2018-05-07 DIAGNOSIS — Z85820 Personal history of malignant melanoma of skin: Secondary | ICD-10-CM

## 2018-05-07 DIAGNOSIS — Z9221 Personal history of antineoplastic chemotherapy: Secondary | ICD-10-CM

## 2018-05-07 DIAGNOSIS — D62 Acute posthemorrhagic anemia: Secondary | ICD-10-CM | POA: Diagnosis not present

## 2018-05-07 DIAGNOSIS — Z96641 Presence of right artificial hip joint: Secondary | ICD-10-CM

## 2018-05-07 HISTORY — PX: TOTAL HIP ARTHROPLASTY: SHX124

## 2018-05-07 SURGERY — ARTHROPLASTY, HIP, TOTAL,POSTERIOR APPROACH
Anesthesia: Spinal | Site: Hip | Laterality: Right

## 2018-05-07 MED ORDER — BUPIVACAINE-EPINEPHRINE (PF) 0.25% -1:200000 IJ SOLN
INTRAMUSCULAR | Status: AC
  Filled 2018-05-07: qty 30

## 2018-05-07 MED ORDER — FENTANYL CITRATE (PF) 100 MCG/2ML IJ SOLN
25.0000 ug | INTRAMUSCULAR | Status: DC | PRN
  Administered 2018-05-07: 25 ug via INTRAVENOUS
  Administered 2018-05-07: 50 ug via INTRAVENOUS
  Administered 2018-05-07 (×3): 25 ug via INTRAVENOUS

## 2018-05-07 MED ORDER — MENTHOL 3 MG MT LOZG: 1.0000 | LOZENGE | OROMUCOSAL | Status: DC | PRN

## 2018-05-07 MED ORDER — SODIUM CHLORIDE 0.9 % IV SOLN
INTRAVENOUS | Status: DC | PRN
  Administered 2018-05-07: 25 ug/min via INTRAVENOUS

## 2018-05-07 MED ORDER — PROPOFOL 10 MG/ML IV BOLUS
INTRAVENOUS | Status: AC
  Filled 2018-05-07: qty 20

## 2018-05-07 MED ORDER — METOCLOPRAMIDE HCL 5 MG/ML IJ SOLN: 5.0000 mg | Freq: Three times a day (TID) | INTRAMUSCULAR | Status: DC | PRN

## 2018-05-07 MED ORDER — ACETAMINOPHEN 10 MG/ML IV SOLN: 1000.0000 mg | Freq: Four times a day (QID) | INTRAVENOUS | Status: DC

## 2018-05-07 MED ORDER — HYDROMORPHONE HCL 1 MG/ML IJ SOLN: 0.2500 mg | INTRAMUSCULAR | Status: DC | PRN

## 2018-05-07 MED ORDER — CHLORHEXIDINE GLUCONATE 4 % EX LIQD: 60.0000 mL | Freq: Once | CUTANEOUS | Status: DC

## 2018-05-07 MED ORDER — VITAMIN D (ERGOCALCIFEROL) 1.25 MG (50000 UNIT) PO CAPS
50000.0000 [IU] | ORAL_CAPSULE | ORAL | Status: DC
  Filled 2018-05-07: qty 1

## 2018-05-07 MED ORDER — SODIUM CHLORIDE 0.9 % IV SOLN
75.0000 mL/h | INTRAVENOUS | Status: DC
  Administered 2018-05-07 – 2018-05-08 (×2): 75 mL/h via INTRAVENOUS

## 2018-05-07 MED ORDER — METOCLOPRAMIDE HCL 5 MG PO TABS: 5.0000 mg | ORAL_TABLET | Freq: Three times a day (TID) | ORAL | Status: DC | PRN

## 2018-05-07 MED ORDER — CEFAZOLIN SODIUM-DEXTROSE 2-4 GM/100ML-% IV SOLN
INTRAVENOUS | Status: AC
  Filled 2018-05-07: qty 100

## 2018-05-07 MED ORDER — SODIUM CHLORIDE 0.9 % IV SOLN: 75.0000 mL/h | INTRAVENOUS | Status: DC

## 2018-05-07 MED ORDER — FENTANYL CITRATE (PF) 250 MCG/5ML IJ SOLN
INTRAMUSCULAR | Status: DC | PRN
  Administered 2018-05-07: 25 ug via INTRAVENOUS
  Administered 2018-05-07: 50 ug via INTRAVENOUS
  Administered 2018-05-07: 25 ug via INTRAVENOUS

## 2018-05-07 MED ORDER — ONDANSETRON HCL 4 MG/2ML IJ SOLN
INTRAMUSCULAR | Status: AC
  Filled 2018-05-07: qty 2

## 2018-05-07 MED ORDER — CEFAZOLIN SODIUM-DEXTROSE 2-4 GM/100ML-% IV SOLN
2.0000 g | INTRAVENOUS | Status: AC
  Administered 2018-05-07: 2 g via INTRAVENOUS

## 2018-05-07 MED ORDER — BUPIVACAINE HCL (PF) 0.5 % IJ SOLN
INTRAMUSCULAR | Status: DC | PRN
  Administered 2018-05-07: 2 mL via INTRATHECAL

## 2018-05-07 MED ORDER — CEFAZOLIN SODIUM-DEXTROSE 2-4 GM/100ML-% IV SOLN
2.0000 g | Freq: Four times a day (QID) | INTRAVENOUS | Status: AC
  Administered 2018-05-07 (×2): 2 g via INTRAVENOUS
  Filled 2018-05-07 (×2): qty 100

## 2018-05-07 MED ORDER — ONDANSETRON HCL 4 MG PO TABS: 4.0000 mg | ORAL_TABLET | Freq: Four times a day (QID) | ORAL | Status: DC | PRN

## 2018-05-07 MED ORDER — MAGNESIUM HYDROXIDE 400 MG/5ML PO SUSP: 30.0000 mL | Freq: Every day | ORAL | Status: DC | PRN

## 2018-05-07 MED ORDER — DIPHENHYDRAMINE HCL 12.5 MG/5ML PO ELIX: 12.5000 mg | ORAL_SOLUTION | ORAL | Status: DC | PRN

## 2018-05-07 MED ORDER — ATENOLOL 50 MG PO TABS
50.0000 mg | ORAL_TABLET | Freq: Every day | ORAL | Status: DC
  Administered 2018-05-08 – 2018-05-09 (×2): 50 mg via ORAL
  Filled 2018-05-07 (×2): qty 1

## 2018-05-07 MED ORDER — ALPRAZOLAM 0.5 MG PO TABS
0.5000 mg | ORAL_TABLET | Freq: Two times a day (BID) | ORAL | Status: DC | PRN
  Administered 2018-05-08 – 2018-05-09 (×2): 0.5 mg via ORAL
  Filled 2018-05-07 (×2): qty 1

## 2018-05-07 MED ORDER — LACTATED RINGERS IV SOLN
INTRAVENOUS | Status: DC | PRN
  Administered 2018-05-07 (×2): via INTRAVENOUS

## 2018-05-07 MED ORDER — ALBUMIN HUMAN 5 % IV SOLN
12.5000 g | Freq: Once | INTRAVENOUS | Status: AC
  Administered 2018-05-07: 12.5 g via INTRAVENOUS

## 2018-05-07 MED ORDER — PROPOFOL 10 MG/ML IV BOLUS
INTRAVENOUS | Status: DC | PRN
  Administered 2018-05-07: 20 mg via INTRAVENOUS

## 2018-05-07 MED ORDER — FENTANYL CITRATE (PF) 100 MCG/2ML IJ SOLN
INTRAMUSCULAR | Status: AC
  Administered 2018-05-07: 25 ug via INTRAVENOUS
  Filled 2018-05-07: qty 2

## 2018-05-07 MED ORDER — DOCUSATE SODIUM 100 MG PO CAPS
100.0000 mg | ORAL_CAPSULE | Freq: Two times a day (BID) | ORAL | Status: DC
  Administered 2018-05-08 – 2018-05-09 (×3): 100 mg via ORAL
  Filled 2018-05-07 (×4): qty 1

## 2018-05-07 MED ORDER — PHENYLEPHRINE 40 MCG/ML (10ML) SYRINGE FOR IV PUSH (FOR BLOOD PRESSURE SUPPORT)
PREFILLED_SYRINGE | INTRAVENOUS | Status: DC | PRN
  Administered 2018-05-07: 80 ug via INTRAVENOUS

## 2018-05-07 MED ORDER — KETOROLAC TROMETHAMINE 15 MG/ML IJ SOLN
7.5000 mg | Freq: Four times a day (QID) | INTRAMUSCULAR | Status: AC
  Administered 2018-05-07 – 2018-05-08 (×4): 7.5 mg via INTRAVENOUS
  Filled 2018-05-07 (×3): qty 1

## 2018-05-07 MED ORDER — MAGNESIUM CITRATE PO SOLN: 1.0000 | Freq: Once | ORAL | Status: DC | PRN

## 2018-05-07 MED ORDER — METHOCARBAMOL 500 MG PO TABS
500.0000 mg | ORAL_TABLET | Freq: Three times a day (TID) | ORAL | Status: DC | PRN
  Administered 2018-05-07 – 2018-05-08 (×2): 500 mg via ORAL
  Filled 2018-05-07: qty 1

## 2018-05-07 MED ORDER — LEVOTHYROXINE SODIUM 75 MCG PO TABS
75.0000 ug | ORAL_TABLET | Freq: Every day | ORAL | Status: DC
  Administered 2018-05-08 – 2018-05-09 (×2): 75 ug via ORAL
  Filled 2018-05-07 (×2): qty 1

## 2018-05-07 MED ORDER — ONDANSETRON HCL 4 MG/2ML IJ SOLN
INTRAMUSCULAR | Status: DC | PRN
  Administered 2018-05-07: 4 mg via INTRAVENOUS

## 2018-05-07 MED ORDER — SERTRALINE HCL 100 MG PO TABS
100.0000 mg | ORAL_TABLET | Freq: Every morning | ORAL | Status: DC
  Administered 2018-05-08 – 2018-05-09 (×2): 100 mg via ORAL
  Filled 2018-05-07 (×2): qty 1

## 2018-05-07 MED ORDER — FENTANYL CITRATE (PF) 100 MCG/2ML IJ SOLN
INTRAMUSCULAR | Status: AC
  Filled 2018-05-07: qty 2

## 2018-05-07 MED ORDER — PROPOFOL 500 MG/50ML IV EMUL
INTRAVENOUS | Status: DC | PRN
  Administered 2018-05-07: 50 ug/kg/min via INTRAVENOUS
  Administered 2018-05-07: 09:00:00 via INTRAVENOUS

## 2018-05-07 MED ORDER — BISACODYL 10 MG RE SUPP: 10.0000 mg | Freq: Every day | RECTAL | Status: DC | PRN

## 2018-05-07 MED ORDER — ACETAMINOPHEN 325 MG PO TABS
650.0000 mg | ORAL_TABLET | Freq: Four times a day (QID) | ORAL | Status: DC
  Administered 2018-05-08 – 2018-05-09 (×3): 650 mg via ORAL
  Filled 2018-05-07 (×4): qty 2

## 2018-05-07 MED ORDER — SODIUM CHLORIDE 0.9 % IR SOLN
Status: DC | PRN
  Administered 2018-05-07: 1000 mL

## 2018-05-07 MED ORDER — ASPIRIN EC 81 MG PO TBEC: 81.0000 mg | DELAYED_RELEASE_TABLET | Freq: Two times a day (BID) | ORAL | Status: DC

## 2018-05-07 MED ORDER — ONDANSETRON HCL 4 MG/2ML IJ SOLN: 4.0000 mg | Freq: Four times a day (QID) | INTRAMUSCULAR | Status: DC | PRN

## 2018-05-07 MED ORDER — KETOROLAC TROMETHAMINE 15 MG/ML IJ SOLN
INTRAMUSCULAR | Status: AC
  Filled 2018-05-07: qty 1

## 2018-05-07 MED ORDER — ALBUMIN HUMAN 5 % IV SOLN
INTRAVENOUS | Status: AC
  Filled 2018-05-07: qty 250

## 2018-05-07 MED ORDER — IRBESARTAN 150 MG PO TABS
150.0000 mg | ORAL_TABLET | Freq: Every day | ORAL | Status: DC
  Administered 2018-05-08 – 2018-05-09 (×2): 150 mg via ORAL
  Filled 2018-05-07 (×2): qty 1

## 2018-05-07 MED ORDER — HYDROCHLOROTHIAZIDE 12.5 MG PO CAPS
12.5000 mg | ORAL_CAPSULE | Freq: Every day | ORAL | Status: DC
  Administered 2018-05-08: 12.5 mg via ORAL
  Filled 2018-05-07 (×2): qty 1

## 2018-05-07 MED ORDER — EZETIMIBE 10 MG PO TABS
10.0000 mg | ORAL_TABLET | Freq: Every day | ORAL | Status: DC
  Administered 2018-05-07 – 2018-05-08 (×2): 10 mg via ORAL
  Filled 2018-05-07 (×2): qty 1

## 2018-05-07 MED ORDER — BUPIVACAINE-EPINEPHRINE (PF) 0.25% -1:200000 IJ SOLN
INTRAMUSCULAR | Status: DC | PRN
  Administered 2018-05-07: 30 mL

## 2018-05-07 MED ORDER — ASPIRIN 81 MG PO CHEW
81.0000 mg | CHEWABLE_TABLET | Freq: Two times a day (BID) | ORAL | Status: DC
  Administered 2018-05-07 – 2018-05-09 (×4): 81 mg via ORAL
  Filled 2018-05-07 (×4): qty 1

## 2018-05-07 MED ORDER — OLMESARTAN MEDOXOMIL-HCTZ 20-12.5 MG PO TABS: 1.0000 | ORAL_TABLET | Freq: Every day | ORAL | Status: DC

## 2018-05-07 MED ORDER — DEXAMETHASONE SODIUM PHOSPHATE 10 MG/ML IJ SOLN
INTRAMUSCULAR | Status: AC
  Filled 2018-05-07: qty 1

## 2018-05-07 MED ORDER — FENTANYL CITRATE (PF) 250 MCG/5ML IJ SOLN
INTRAMUSCULAR | Status: AC
  Filled 2018-05-07: qty 5

## 2018-05-07 MED ORDER — DEXAMETHASONE SODIUM PHOSPHATE 10 MG/ML IJ SOLN
INTRAMUSCULAR | Status: DC | PRN
  Administered 2018-05-07: 10 mg via INTRAVENOUS

## 2018-05-07 MED ORDER — LIDOCAINE 2% (20 MG/ML) 5 ML SYRINGE
INTRAMUSCULAR | Status: AC
  Filled 2018-05-07: qty 5

## 2018-05-07 MED ORDER — PHENYLEPHRINE 40 MCG/ML (10ML) SYRINGE FOR IV PUSH (FOR BLOOD PRESSURE SUPPORT)
PREFILLED_SYRINGE | INTRAVENOUS | Status: AC
  Filled 2018-05-07: qty 10

## 2018-05-07 MED ORDER — PHENOL 1.4 % MT LIQD: 1.0000 | OROMUCOSAL | Status: DC | PRN

## 2018-05-07 MED ORDER — METHOCARBAMOL 1000 MG/10ML IJ SOLN
500.0000 mg | Freq: Three times a day (TID) | INTRAVENOUS | Status: DC | PRN
  Filled 2018-05-07: qty 5

## 2018-05-07 MED ORDER — ALUM & MAG HYDROXIDE-SIMETH 200-200-20 MG/5ML PO SUSP: 30.0000 mL | ORAL | Status: DC | PRN

## 2018-05-07 MED ORDER — LIDOCAINE HCL (CARDIAC) PF 100 MG/5ML IV SOSY
PREFILLED_SYRINGE | INTRAVENOUS | Status: DC | PRN
  Administered 2018-05-07: 40 mg via INTRAVENOUS

## 2018-05-07 MED ORDER — METHOCARBAMOL 500 MG PO TABS
ORAL_TABLET | ORAL | Status: AC
  Filled 2018-05-07: qty 1

## 2018-05-07 MED ORDER — OXYCODONE HCL 5 MG PO TABS
5.0000 mg | ORAL_TABLET | ORAL | Status: DC | PRN
  Administered 2018-05-07 – 2018-05-09 (×7): 5 mg via ORAL
  Filled 2018-05-07 (×7): qty 1

## 2018-05-07 SURGICAL SUPPLY — 60 items
BLADE SAW SAG 73X25 THK (BLADE) ×1
BLADE SAW SGTL 73X25 THK (BLADE) ×2 IMPLANT
COVER SURGICAL LIGHT HANDLE (MISCELLANEOUS) ×3 IMPLANT
COVER WAND RF STERILE (DRAPES) ×3 IMPLANT
CUP SECTOR GRIPTON 50MM (Cup) ×3 IMPLANT
DECANTER SPIKE VIAL GLASS SM (MISCELLANEOUS) ×3 IMPLANT
DRAPE INCISE IOBAN 66X45 STRL (DRAPES) IMPLANT
DRAPE ORTHO SPLIT 77X108 STRL (DRAPES) ×4
DRAPE SURG ORHT 6 SPLT 77X108 (DRAPES) ×2 IMPLANT
DRSG MEPILEX BORDER 4X12 (GAUZE/BANDAGES/DRESSINGS) ×3 IMPLANT
DURAPREP 26ML APPLICATOR (WOUND CARE) ×6 IMPLANT
ELECT BLADE 4.0 EZ CLEAN MEGAD (MISCELLANEOUS) ×3
ELECT REM PT RETURN 9FT ADLT (ELECTROSURGICAL) ×3
ELECTRODE BLDE 4.0 EZ CLN MEGD (MISCELLANEOUS) ×1 IMPLANT
ELECTRODE REM PT RTRN 9FT ADLT (ELECTROSURGICAL) ×1 IMPLANT
ELIMINATOR HOLE APEX DEPUY (Hips) ×3 IMPLANT
EVACUATOR 1/8 PVC DRAIN (DRAIN) IMPLANT
FACESHIELD WRAPAROUND (MASK) ×6 IMPLANT
GLOVE BIOGEL PI IND STRL 8 (GLOVE) ×2 IMPLANT
GLOVE BIOGEL PI IND STRL 8.5 (GLOVE) ×1 IMPLANT
GLOVE BIOGEL PI INDICATOR 8 (GLOVE) ×4
GLOVE BIOGEL PI INDICATOR 8.5 (GLOVE) ×2
GLOVE ECLIPSE 8.0 STRL XLNG CF (GLOVE) ×6 IMPLANT
GLOVE ECLIPSE 8.5 STRL (GLOVE) ×6 IMPLANT
GOWN STRL REUS W/ TWL LRG LVL3 (GOWN DISPOSABLE) ×2 IMPLANT
GOWN STRL REUS W/TWL 2XL LVL3 (GOWN DISPOSABLE) ×6 IMPLANT
GOWN STRL REUS W/TWL LRG LVL3 (GOWN DISPOSABLE) ×4
HANDPIECE INTERPULSE COAX TIP (DISPOSABLE)
HEAD FEM STD 32X+1 STRL (Hips) ×3 IMPLANT
IMMOBILIZER KNEE 22 UNIV (SOFTGOODS) ×3 IMPLANT
KIT BASIN OR (CUSTOM PROCEDURE TRAY) ×3 IMPLANT
KIT TURNOVER KIT B (KITS) ×3 IMPLANT
LINER PINN ALTRX ACE P4 HIP (Liner) ×3 IMPLANT
MANIFOLD NEPTUNE II (INSTRUMENTS) ×3 IMPLANT
NEEDLE 22X1 1/2 (OR ONLY) (NEEDLE) ×3 IMPLANT
NS IRRIG 1000ML POUR BTL (IV SOLUTION) ×3 IMPLANT
PACK TOTAL JOINT (CUSTOM PROCEDURE TRAY) ×3 IMPLANT
PAD ARMBOARD 7.5X6 YLW CONV (MISCELLANEOUS) ×6 IMPLANT
SCREW 6.5MMX25MM (Screw) ×3 IMPLANT
SET HNDPC FAN SPRY TIP SCT (DISPOSABLE) IMPLANT
STAPLER VISISTAT 35W (STAPLE) IMPLANT
STEM AML 12.0 STD 6 LRG (Hips) ×3 IMPLANT
SUCTION FRAZIER HANDLE 10FR (MISCELLANEOUS) ×2
SUCTION TUBE FRAZIER 10FR DISP (MISCELLANEOUS) ×1 IMPLANT
SUT BONE WAX W31G (SUTURE) IMPLANT
SUT ETHIBOND NAB CT1 #1 30IN (SUTURE) ×9 IMPLANT
SUT MNCRL AB 3-0 PS2 18 (SUTURE) ×3 IMPLANT
SUT VIC AB 0 CT1 27 (SUTURE) ×4
SUT VIC AB 0 CT1 27XBRD ANBCTR (SUTURE) ×2 IMPLANT
SUT VIC AB 1 CT1 27 (SUTURE) ×4
SUT VIC AB 1 CT1 27XBRD ANBCTR (SUTURE) ×2 IMPLANT
SUT VIC AB 2-0 CT1 27 (SUTURE) ×2
SUT VIC AB 2-0 CT1 TAPERPNT 27 (SUTURE) ×1 IMPLANT
SYR CONTROL 10ML LL (SYRINGE) ×3 IMPLANT
TOWEL OR 17X24 6PK STRL BLUE (TOWEL DISPOSABLE) ×3 IMPLANT
TOWEL OR 17X26 10 PK STRL BLUE (TOWEL DISPOSABLE) ×3 IMPLANT
TRAY CATH 16FR W/PLASTIC CATH (SET/KITS/TRAYS/PACK) IMPLANT
TRAY FOLEY CATH 14FR (SET/KITS/TRAYS/PACK) ×3 IMPLANT
WATER STERILE IRR 1000ML POUR (IV SOLUTION) ×3 IMPLANT
WRAP KNEE MAXI GEL POST OP (GAUZE/BANDAGES/DRESSINGS) ×3 IMPLANT

## 2018-05-07 NOTE — Progress Notes (Signed)
PATIENT ID:      Karina [name]  MRN:     [id number] DOB/AGE:    [birth year] / 77 y.o.       OPERATIVE REPORT    DATE OF PROCEDURE:  05/07/2018       PREOPERATIVE DIAGNOSIS: end stage  right hip osteoarthritis                                                       Estimated body mass index is 25.39 kg/m as calculated from the following:   Height as of 04/26/18: 5' (1.524 m).   Weight as of 04/26/18: 59 kg.     POSTOPERATIVE DIAGNOSIS: end stage  right hip osteoarthritis                                                                     Estimated body mass index is 25.39 kg/m as calculated from the following:   Height as of 04/26/18: 5' (1.524 m).   Weight as of 04/26/18: 59 kg.     PROCEDURE:  Procedure(s): RIGHT TOTAL HIP ARTHROPLASTY      SURGEON:  [name], MD    ASSISTANT:   Biagio Borg, PA-C   (Present and scrubbed throughout the case, critical for assistance with exposure, retraction, instrumentation, and closure.)          ANESTHESIA: spinal and IV sedation     DRAINS: none :      TOURNIQUET TIME: * No tourniquets in log *    COMPLICATIONS:  None   CONDITION:  stable  PROCEDURE IN DETAIL: [place] 05/07/2018, 9:18 AM  Patient ID: Karina [name], female   DOB: February 14, 1941, 77 y.o.   MRN: [id number]

## 2018-05-07 NOTE — Transfer of Care (Signed)
Immediate Anesthesia Transfer of Care Note  Patient: Karina [name]  Procedure(s) Performed: RIGHT TOTAL HIP ARTHROPLASTY (Right Hip)  Patient Location: PACU  Anesthesia Type:Spinal  Level of Consciousness: awake, alert , oriented, drowsy and patient cooperative  Airway & Oxygen Therapy: Patient Spontanous Breathing and Patient connected to face mask oxygen  Post-op Assessment: Report given to RN and Post -op Vital signs reviewed and stable  Post vital signs: Reviewed and stable  Last Vitals:  Vitals Value Taken Time  BP 96/57 05/07/2018  9:47 AM  Temp    Pulse 71 05/07/2018  9:48 AM  Resp 15 05/07/2018  9:48 AM  SpO2 100 % 05/07/2018  9:48 AM  Vitals shown include unvalidated device data.  Last Pain:  Vitals:   05/07/18 0615  PainSc: 7       Patients Stated Pain Goal: 3 (39/[id number])  Complications: No apparent anesthesia complications

## 2018-05-07 NOTE — Progress Notes (Signed)
The recent History & Physical has been reviewed. I have personally examined the patient today. There is no interval change to the documented History & Physical. The patient would like to proceed with the procedure.  Garald Balding 05/07/2018,  7:10 AM  Patient ID: Karina [name], female   DOB: May 24, 1941, 77 y.o.   MRN: [id number]

## 2018-05-07 NOTE — Evaluation (Signed)
Physical Therapy Evaluation Patient Details Name: Karina [name] MRN: [id number] DOB: [birth year] Today's Date: 05/07/2018   History of Present Illness  Pt is a 77 y/o female s/p elective R THA, posterior approach. PMH includes HTN, melanoma, and osteoporosis.   Clinical Impression  Pt is s/p surgery above with deficits below. Pt very limited secondary to pain this session. Educated about posterior hip precautions, however, pt required consistent cues throughout mobility. Anticipate pt will progress well once pain controlled. Will continue to follow acutely to maximize functional mobility independence and safety.     Follow Up Recommendations Follow surgeon's recommendation for DC plan and follow-up therapies;Supervision for mobility/OOB    Equipment Recommendations  3in1 (PT)    Recommendations for Other Services OT consult     Precautions / Restrictions Precautions Precautions: Posterior Hip Precaution Booklet Issued: Yes (comment) Precaution Comments: Reviewed posterior hip precautions.  Required Braces or Orthoses: Knee Immobilizer - Right Knee Immobilizer - Right: Other (comment)(when in bed ) Restrictions Weight Bearing Restrictions: Yes RLE Weight Bearing: Weight bearing as tolerated      Mobility  Bed Mobility Overal bed mobility: Needs Assistance Bed Mobility: Supine to Sit     Supine to sit: Min assist     General bed mobility comments: Min A for RLE assist. Verbal cues to maintain hip precautions.   Transfers Overall transfer level: Needs assistance Equipment used: Rolling walker (2 wheeled) Transfers: Sit to/from Stand Sit to Stand: Min assist         General transfer comment: Min A for lift assist and steadying. Verbal cues for safe hand placement.   Ambulation/Gait Ambulation/Gait assistance: Min guard;Min assist Gait Distance (Feet): 15 Feet Assistive device: Rolling walker (2 wheeled) Gait Pattern/deviations: Step-to pattern;Decreased step length  - right;Decreased step length - left;Decreased weight shift to right;Antalgic Gait velocity: Decreased    General Gait Details: Very slow, antalgic gait. Pt very guarded and required cues for sequencing using RW. Also required cues to maintain hip precautions, especially when turning.   Stairs            Wheelchair Mobility    Modified Rankin (Stroke Patients Only)       Balance Overall balance assessment: Needs assistance Sitting-balance support: No upper extremity supported;Feet supported Sitting balance-Leahy Scale: Good     Standing balance support: Bilateral upper extremity supported;During functional activity Standing balance-Leahy Scale: Poor Standing balance comment: Heavily reliant on UE support.                              Pertinent Vitals/Pain Pain Assessment: Faces Faces Pain Scale: Hurts whole lot Pain Location: R hip  Pain Descriptors / Indicators: Aching;Operative site guarding Pain Intervention(s): Limited activity within patient's tolerance;Monitored during session;Repositioned    Home Living Family/patient expects to be discharged to:: Private residence Living Arrangements: Alone Available Help at Discharge: Family;Friend(s);Available PRN/intermittently Type of Home: House Home Access: Stairs to enter Entrance Stairs-Rails: Right;Left;Can reach both Entrance Stairs-Number of Steps: 3 Home Layout: One level Home Equipment: Walker - 2 wheels;Cane - single point      Prior Function Level of Independence: Independent with assistive device(s)         Comments: Was using 2 canes at one point and used RW more recently secondary to pain.      Hand Dominance        Extremity/Trunk Assessment   Upper Extremity Assessment Upper Extremity Assessment: Defer to OT evaluation    Lower  Extremity Assessment Lower Extremity Assessment: RLE deficits/detail RLE Deficits / Details: Deficits consistent with post op pain and weakness.      Cervical / Trunk Assessment Cervical / Trunk Assessment: Normal  Communication   Communication: No difficulties  Cognition Arousal/Alertness: Awake/alert Behavior During Therapy: WFL for tasks assessed/performed Overall Cognitive Status: Within Functional Limits for tasks assessed                                        General Comments      Exercises     Assessment/Plan    PT Assessment Patient needs continued PT services  PT Problem List Decreased strength;Decreased range of motion;Decreased activity tolerance;Decreased balance;Decreased mobility;Decreased knowledge of use of DME;Decreased knowledge of precautions;Pain       PT Treatment Interventions DME instruction;Stair training;Gait training;Therapeutic activities;Functional mobility training;Balance training;Therapeutic exercise;Patient/family education    PT Goals (Current goals can be found in the Care Plan section)  Acute Rehab PT Goals Patient Stated Goal: to get back to normal  PT Goal Formulation: With patient Time For Goal Achievement: 05/21/18 Potential to Achieve Goals: Good    Frequency 7X/week   Barriers to discharge        Co-evaluation               AM-PAC PT "6 Clicks" Daily Activity  Outcome Measure Difficulty turning over in bed (including adjusting bedclothes, sheets and blankets)?: A Lot Difficulty moving from lying on back to sitting on the side of the bed? : Unable Difficulty sitting down on and standing up from a chair with arms (e.g., wheelchair, bedside commode, etc,.)?: Unable Help needed moving to and from a bed to chair (including a wheelchair)?: A Little Help needed walking in hospital room?: A Little Help needed climbing 3-5 steps with a railing? : A Lot 6 Click Score: 12    End of Session Equipment Utilized During Treatment: Gait belt;Right knee immobilizer Activity Tolerance: Patient limited by pain Patient left: in chair;with call bell/phone within  reach Nurse Communication: Mobility status;Other (comment)(posterior hip precautions ) PT Visit Diagnosis: Unsteadiness on feet (R26.81);Other abnormalities of gait and mobility (R26.89);Muscle weakness (generalized) (M62.81);Pain Pain - Right/Left: Right Pain - part of body: Hip    Time: 1625-1700 PT Time Calculation (min) (ACUTE ONLY): 35 min   Charges:   PT Evaluation $PT Eval Low Complexity: 1 Low PT Treatments $Therapeutic Activity: 8-22 mins        Leighton Ruff, PT, DPT  Acute Rehabilitation Services  Pager: [phone] Office: [phone]   Rudean Hitt 05/07/2018, 5:04 PM

## 2018-05-07 NOTE — Progress Notes (Signed)
1600 Received pt from PACU, A&O x4. Right hip dressing dry and intact. No pain at this time.

## 2018-05-07 NOTE — Plan of Care (Signed)

## 2018-05-07 NOTE — Op Note (Signed)
NAMEMECCA, Karina [name] MEDICAL RECORD [id number] ACCOUNT [id number] DATE OF BIRTH:[birth year] FACILITY: [facility] LOCATION: [place], [place]  OPERATIVE REPORT  DATE OF PROCEDURE:  05/07/2018  PREOPERATIVE DIAGNOSIS:  End-stage osteoarthritis, right hip.  POSTOPERATIVE DIAGNOSIS:  End-stage osteoarthritis, right hip.  PROCEDURE:  Right total hip replacement.  SURGEON:  [name], MD  ASSISTANT:  Jonell Cluck, PA-C.  ANESTHESIA:  Spinal with IV sedation.  COMPLICATIONS:  None.  COMPONENTS:  DePuy AML 12 mm large stature femoral component, a 32 mm outer diameter hip ball with a +1 neck, a 50 mm outer diameter Gription 3 acetabular shell with a single 6.5 mm x 25 mm bone screw, Apex hole eliminator and a +4 Marathon polyethylene  acetabular liner with a 10 degree posterior lip.  All components were press-fit.  DESCRIPTION OF PROCEDURE:  The patient was met in the holding area and identified the right hip was the appropriate operative site and marked it accordingly.  Any questions were answered.  The patient was then transported to room 7.  She was given a spinal anesthetic per anesthesia without difficulty.  She was then placed supine on the operating table.  Nursing staff inserted a Foley catheter.  Urine was clear.  The patient was then placed in the lateral decubitus position with the right side up and secured to the operating table with the Innomed hip system.  The right lower extremity was then prepped with chlorhexidine scrub and DuraPrep x2 from the iliac crest  to the ankle.  Sterile draping was performed.  Timeout was called.  A routine Southern incision was utilized and via sharp dissection carried down to the subcutaneous tissue.  Small bleeders were Bovie coagulated.  Using the Bovie, incision was carried down through adipose tissue to the level of the IT band.  This was  incised along the length of the skin incision.  Again, hemostasis was  provided with the Bovie.  With the hip internally rotated, the short external rotators were identified.  They were carefully incised.  Tendinous structures were tagged with Ethibond suture and then incised.  The capsule was identified and incised along the femoral neck and head.  There was a bloody effusion.  The head was then easily dislocated posteriorly.  There was considerable deformity and an abundant amount of cartilaginous debris in the acetabulum.  I osteotomized the femoral head and neck 1 cm proximal to the lesser trochanter.  A starter hole was then made in the piriformis fossa.  Reaming was performed sequentially to 11.5 mm to accept a 12 mm component.  Rasping was performed sequentially to a 12 large femoral component, which fit beautifully on the calcar.  There were no  complications.  Retractors were then placed about the acetabulum.  I removed the cartilaginous debris and excised the labrum.  Retractors were carefully placed about the acetabulum for visualization.  I palpated the sciatic nerve and was well out of harm's way.  I  checked this on several occasions during the procedure.  Reaming was performed sequentially to 49 mm to accept a 50 mm component.  I trialed the 48 mm component with good rim fit, but would completely seat.  The 50 had good rim fit, but would not completely seat.  Circumferential bleeding.  The Gription three acetabular component was then impacted flush into the calcar.  I thought it was quite stable, but I did use a single 25 mm long x 6.5 mm acetabular screw based on the patient's osteopenia and age.  This was drilled, measured and then  inserted without difficulty.  I inserted the trial polyethylene liner +4 with a 10 degree posterior lip and then inserted the large 12 mm femoral rasp.  This was followed by the +1 mm neck length with a 32 mm outer diameter hip ball.  The entire  construct was reduced and through a full range of motion, I could not  dislocate or sublux the hip.  I thought we had excellent coverage.  The trial components were then removed.  I irrigated the joint copiously with saline solution.  Apex hole eliminator was inserted followed by the final Marathon polyethylene liner +4 with a 10 degree posterior lip.  I checked to be sure it was perfectly  stable.  The large stature 12 mm femoral component was then impacted flush on the calcar without complication.  I inserted the 32 mm outer diameter hip ball with a +1 neck after cleaning the Morse taper neck.  I cleaned the acetabulum after irrigating it.  The  entire construct was then reduced and again through a full range of motion remained perfectly stable.  The wound was irrigated with saline solution.  I then applied the tranexamic acid topically with compression for at least 5 minutes with excellent hemostasis.  I did use 0.25% Marcaine with epinephrine into the deep capsule end of the IT band.  The capsule was closed with a running 0 Ethibond.  Short external rotators closed with a similar material.  The IT band was then closed with a running #1 Vicryl.  The wound was again irrigated with saline solution.  The remaining wound was then closed with Vicryl and then a 3-0 Monocryl in the subcutaneous region.  Skin was closed with skin clips.  Sterile  bulky dressing was applied.  The patient tolerated the procedure without complications.  TN/NUANCE  D:05/07/2018 T:05/07/2018 JOB:[id number]

## 2018-05-07 NOTE — Progress Notes (Signed)
Orthopedic Tech Progress Note Patient Details:  Karina [name] 1941-02-04 [id number]  Ortho Devices Type of Ortho Device: Knee Immobilizer Ortho Device/Splint Location: Trapeze bar Ortho Device/Splint Interventions: Application   Post Interventions Patient Tolerated: Well Instructions Provided: Care of device, Adjustment of device   [place] Pink 05/07/2018, 6:34 PM

## 2018-05-07 NOTE — Anesthesia Procedure Notes (Signed)
Spinal  Patient location during procedure: OR Start time: 05/07/2018 7:22 AM End time: 05/07/2018 7:27 AM Staffing Anesthesiologist: Roderic Palau, MD Performed: anesthesiologist  Preanesthetic Checklist Completed: patient identified, surgical consent, pre-op evaluation, timeout performed, IV checked, risks and benefits discussed and monitors and equipment checked Spinal Block Patient position: sitting Prep: DuraPrep Patient monitoring: cardiac monitor, continuous pulse ox and blood pressure Approach: midline Location: L3-4 Injection technique: single-shot Needle Needle type: Pencan  Needle gauge: 24 G Needle length: 9 cm Assessment Sensory level: T8 Additional Notes Functioning IV was confirmed and monitors were applied. Sterile prep and drape, including hand hygiene and sterile gloves were used. The patient was positioned and the spine was prepped. The skin was anesthetized with lidocaine.  Free flow of clear CSF was obtained prior to injecting local anesthetic into the CSF.  The spinal needle aspirated freely following injection.  The needle was carefully withdrawn.  The patient tolerated the procedure well.

## 2018-05-07 NOTE — Anesthesia Postprocedure Evaluation (Signed)
Anesthesia Post Note  Patient: Karina [name]  Procedure(s) Performed: RIGHT TOTAL HIP ARTHROPLASTY (Right Hip)     Patient location during evaluation: PACU Anesthesia Type: Spinal Level of consciousness: oriented and awake and alert Pain management: pain level controlled Vital Signs Assessment: post-procedure vital signs reviewed and stable Respiratory status: spontaneous breathing and respiratory function stable Cardiovascular status: blood pressure returned to baseline and stable Postop Assessment: no headache, no backache, no apparent nausea or vomiting, spinal receding and patient able to bend at knees Anesthetic complications: no    Last Vitals:  Vitals:   05/07/18 1245 05/07/18 1246  BP: 129/62 109/70  Pulse:  66  Resp: 14 12  Temp:    SpO2:  100%    Last Pain:  Vitals:   05/07/18 1157  PainSc: 6                  Jillayne Witte,W. EDMOND

## 2018-05-08 ENCOUNTER — Encounter (HOSPITAL_COMMUNITY): Payer: Self-pay | Admitting: Orthopaedic Surgery

## 2018-05-08 LAB — CBC
HEMATOCRIT: 25.4 % — AB (ref 36.0–46.0)
HEMOGLOBIN: 8.1 g/dL — AB (ref 12.0–15.0)
MCH: 28.7 pg (ref 26.0–34.0)
MCHC: 31.9 g/dL (ref 30.0–36.0)
MCV: 90.1 fL (ref 80.0–100.0)
Platelets: 189 10*3/uL (ref 150–400)
RBC: 2.82 MIL/uL — ABNORMAL LOW (ref 3.87–5.11)
RDW: 13.2 % (ref 11.5–15.5)
WBC: 8.2 10*3/uL (ref 4.0–10.5)
nRBC: 0 % (ref 0.0–0.2)

## 2018-05-08 LAB — BASIC METABOLIC PANEL
Anion gap: 9 (ref 5–15)
BUN: 18 mg/dL (ref 8–23)
CHLORIDE: 101 mmol/L (ref 98–111)
CO2: 26 mmol/L (ref 22–32)
CREATININE: 0.79 mg/dL (ref 0.44–1.00)
Calcium: 8.4 mg/dL — ABNORMAL LOW (ref 8.9–10.3)
GFR calc Af Amer: >60 mL/min (ref >60)
GFR calc non Af Amer: >60 mL/min (ref >60)
GLUCOSE: 129 mg/dL — AB (ref 70–99)
Potassium: 3.4 mmol/L — ABNORMAL LOW (ref 3.5–5.1)
Sodium: 136 mmol/L (ref 135–145)

## 2018-05-08 NOTE — Evaluation (Signed)
Occupational Therapy Evaluation Patient Details Name: Karina [name] MRN: [id number] DOB: November 01, 1940 Today's Date: 05/08/2018    History of Present Illness Pt is a 77 y/o female s/p elective R THA, posterior approach. PMH includes HTN, melanoma, and osteoporosis.    Clinical Impression   PTA Pt mod I for ADL and mobility with RW/SPC. Pt is currently min guard for transfers and in room mobility with RW. Posterior hip precautions require her to be mod A for LB ADL at this time. Pt will require continued OT in the acute setting and plan on MD plan for follow up therapies - OT would prefer that [place] be ordered to see and assist Pt function in her natural environment and provide follow up for adaptive equipment. Next session plan on AE demo and practice as well as review shower safety/transfers (Pt does have a walk in shower).    Follow Up Recommendations  Home health OT;Follow surgeon's recommendation for DC plan and follow-up therapies    Equipment Recommendations  3 in 1 bedside commode;Other (comment)(AE for LB ADL)    Recommendations for Other Services       Precautions / Restrictions Precautions Precautions: Posterior Hip Precaution Booklet Issued: Yes (comment) Precaution Comments: Reviewed posterior hip precautions.  Required Braces or Orthoses: Knee Immobilizer - Right Knee Immobilizer - Right: Other (comment)(in bed) Restrictions Weight Bearing Restrictions: Yes RLE Weight Bearing: Weight bearing as tolerated      Mobility Bed Mobility Overal bed mobility: Needs Assistance Bed Mobility: Supine to Sit     Supine to sit: Min guard Sit to supine: Min assist   General bed mobility comments: use of bed rail to assist  Transfers Overall transfer level: Needs assistance Equipment used: Rolling walker (2 wheeled) Transfers: Sit to/from Stand Sit to Stand: Min guard         General transfer comment: cues for hand placement    Balance Overall balance assessment:  Needs assistance Sitting-balance support: No upper extremity supported;Feet supported Sitting balance-Leahy Scale: Good     Standing balance support: Bilateral upper extremity supported;During functional activity Standing balance-Leahy Scale: Poor Standing balance comment: UE support for balance or leans on sink to wash hands                           ADL either performed or assessed with clinical judgement   ADL Overall ADL's : Needs assistance/impaired Eating/Feeding: Independent   Grooming: Min guard;Standing Grooming Details (indicate cue type and reason): sink level Upper Body Bathing: Set up;Sitting   Lower Body Bathing: Moderate assistance;Sitting/lateral leans   Upper Body Dressing : Set up;Sitting   Lower Body Dressing: Maximal assistance Lower Body Dressing Details (indicate cue type and reason): unable to bend or cross legs due to precautions Toilet Transfer: Min guard;Ambulation;RW;Comfort height toilet;Grab bars   Toileting- Clothing Manipulation and Hygiene: Set up;Sitting/lateral lean Toileting - Clothing Manipulation Details (indicate cue type and reason): front peri care     Functional mobility during ADLs: Min guard;Rolling walker General ADL Comments: will require AE for LB due to posterior hip precautions     Vision         Perception     Praxis      Pertinent Vitals/Pain Pain Assessment: Faces Faces Pain Scale: Hurts even more Pain Location: R hip  Pain Descriptors / Indicators: Aching;Operative site guarding Pain Intervention(s): Limited activity within patient's tolerance;Monitored during session;Repositioned(declined ice)     Hand Dominance     Extremity/Trunk  Assessment Upper Extremity Assessment Upper Extremity Assessment: Generalized weakness           Communication Communication Communication: No difficulties   Cognition Arousal/Alertness: Awake/alert Behavior During Therapy: WFL for tasks  assessed/performed Overall Cognitive Status: Within Functional Limits for tasks assessed                                     General Comments  Pt will need demo and practice with AE kit     Exercises Total Joint Exercises Ankle Circles/Pumps: AROM;10 reps;Both;Supine Quad Sets: AROM;10 reps;Right;Supine Short Arc Quad: AROM;10 reps;Right;Supine Heel Slides: AAROM;10 reps;AROM;Supine;Right Hip ABduction/ADduction: AROM;AAROM;Right   Shoulder Instructions      Home Living Family/patient expects to be discharged to:: Private residence Living Arrangements: Alone Available Help at Discharge: Family;Friend(s);Available PRN/intermittently Type of Home: House Home Access: Stairs to enter [employer] of Steps: 3 Entrance Stairs-Rails: Right;Left;Can reach both Home Layout: One level     Bathroom Shower/Tub: Occupational psychologist: Standard     Home Equipment: Environmental consultant - 2 wheels;Cane - single point          Prior Functioning/Environment Level of Independence: Independent with assistive device(s)        Comments: Was using 2 canes at one point and used RW more recently secondary to pain.         OT Problem List: Decreased range of motion;Decreased activity tolerance;Impaired balance (sitting and/or standing);Decreased knowledge of use of DME or AE;Decreased safety awareness;Decreased knowledge of precautions;Pain      OT Treatment/Interventions: Self-care/ADL training;DME and/or AE instruction;Therapeutic activities;Patient/family education;Balance training    OT Goals(Current goals can be found in the care plan section) Acute Rehab OT Goals Patient Stated Goal: to get back to normal  OT Goal Formulation: With patient Time For Goal Achievement: 05/22/18 Potential to Achieve Goals: Good ADL Goals Pt Will Perform Grooming: with modified independence;standing Pt Will Perform Lower Body Bathing: with modified independence;with adaptive  equipment;sit to/from stand Pt Will Perform Lower Body Dressing: with modified independence;with adaptive equipment;sit to/from stand Pt Will Perform Tub/Shower Transfer: with modified independence;ambulating;3 in 1;rolling walker  OT Frequency: Min 3X/week   Barriers to D/C: Decreased caregiver support  Pt will only have 24 hour assist for the first week       Co-evaluation              AM-PAC PT "6 Clicks" Daily Activity     Outcome Measure Help from another person eating meals?: None Help from another person taking care of personal grooming?: A Little Help from another person toileting, which includes using toliet, bedpan, or urinal?: A Little Help from another person bathing (including washing, rinsing, drying)?: A Lot Help from another person to put on and taking off regular upper body clothing?: A Little Help from another person to put on and taking off regular lower body clothing?: A Lot 6 Click Score: 17   End of Session Equipment Utilized During Treatment: Gait belt;Rolling walker Nurse Communication: Mobility status;Precautions;Weight bearing status  Activity Tolerance: Patient tolerated treatment well Patient left: in chair;with call bell/phone within reach;with chair alarm set;with family/visitor present  OT Visit Diagnosis: Unsteadiness on feet (R26.81);Other abnormalities of gait and mobility (R26.89);Pain Pain - Right/Left: Right Pain - part of body: Hip                Time: 4801-6553 OT Time Calculation (min): 31 min Charges:  OT General Charges $OT Visit: 1 Visit OT Evaluation $OT Eval Moderate Complexity: 1 Mod OT Treatments $Self Care/Home Management : 8-22 mins  Hulda Humphrey OTR/L Acute Rehabilitation Services Pager: [phone] Office: [place] 05/08/2018, 5:32 PM

## 2018-05-08 NOTE — Progress Notes (Signed)
Physical Therapy Treatment Patient Details Name: Karina [name] MRN: [id number] DOB: [birth year] Today's Date: 05/08/2018    History of Present Illness Pt is a 77 y/o female s/p elective R THA, posterior approach. PMH includes HTN, melanoma, and osteoporosis.     PT Comments    Patient able to perform HEP this pm with handout given, and negotiate stairs with sister present to see how to assist.  Feel safe for d/c home when stable per MD though remains slow and limited with ambulation distance due to pain/fatigue.  PT to follow acutely.   Follow Up Recommendations  Follow surgeon's recommendation for DC plan and follow-up therapies;Supervision for mobility/OOB     Equipment Recommendations  3in1 (PT)    Recommendations for Other Services       Precautions / Restrictions Precautions Precautions: Posterior Hip Precaution Comments: Reviewed posterior hip precautions.  Required Braces or Orthoses: Knee Immobilizer - Right Knee Immobilizer - Right: Other (comment)(in bed) Restrictions RLE Weight Bearing: Weight bearing as tolerated    Mobility  Bed Mobility Overal bed mobility: Needs Assistance Bed Mobility: Sit to Supine     Supine to sit: Min assist Sit to supine: Min assist   General bed mobility comments: for R LE  Transfers Overall transfer level: Needs assistance Equipment used: Rolling walker (2 wheeled) Transfers: Sit to/from Stand Sit to Stand: Min guard;Supervision         General transfer comment: cues for hand placement  Ambulation/Gait Ambulation/Gait assistance: Supervision;Min guard Gait Distance (Feet): 55 Feet Assistive device: Rolling walker (2 wheeled) Gait Pattern/deviations: Step-through pattern;Step-to pattern;Decreased stride length;Antalgic     General Gait Details: increased time for all mobility; cues for safety with keeping walker still while stepping with L LE   Stairs Stairs: Yes Stairs assistance: Min guard Stair Management:  Forwards;Two rails;Step to pattern Number of Stairs: 3 General stair comments: sister present and assisted with guarding pt on stairs, cues for sequence   Wheelchair Mobility    Modified Rankin (Stroke Patients Only)       Balance Overall balance assessment: Needs assistance   Sitting balance-Leahy Scale: Good     Standing balance support: Bilateral upper extremity supported;During functional activity Standing balance-Leahy Scale: Poor Standing balance comment: UE support for balance or leans on sink to wash hands                            Cognition Arousal/Alertness: Awake/alert Behavior During Therapy: WFL for tasks assessed/performed Overall Cognitive Status: Within Functional Limits for tasks assessed                                        Exercises Total Joint Exercises Ankle Circles/Pumps: AROM;10 reps;Both;Supine Quad Sets: AROM;10 reps;Right;Supine Short Arc Quad: AROM;10 reps;Right;Supine Heel Slides: AAROM;10 reps;AROM;Supine;Right Hip ABduction/ADduction: AROM;AAROM;Right    General Comments General comments (skin integrity, edema, etc.): educated how to turn wheels on walker to make it more narrow to possibly fit into bathroom door at home      Pertinent Vitals/Pain Faces Pain Scale: Hurts little more Pain Location: R hip  Pain Descriptors / Indicators: Aching;Operative site guarding Pain Intervention(s): Repositioned;Monitored during session    Home Living                      Prior Function  PT Goals (current goals can now be found in the care plan section) Progress towards PT goals: Progressing toward goals    Frequency    7X/week      PT Plan Current plan remains appropriate    Co-evaluation              AM-PAC PT "6 Clicks" Daily Activity  Outcome Measure  Difficulty turning over in bed (including adjusting bedclothes, sheets and blankets)?: A Lot Difficulty moving from lying  on back to sitting on the side of the bed? : Unable Difficulty sitting down on and standing up from a chair with arms (e.g., wheelchair, bedside commode, etc,.)?: Unable Help needed moving to and from a bed to chair (including a wheelchair)?: A Little Help needed walking in hospital room?: A Little Help needed climbing 3-5 steps with a railing? : A Little 6 Click Score: 13    End of Session Equipment Utilized During Treatment: Gait belt Activity Tolerance: Patient tolerated treatment well Patient left: in bed;with call bell/phone within reach   PT Visit Diagnosis: Unsteadiness on feet (R26.81);Other abnormalities of gait and mobility (R26.89);Muscle weakness (generalized) (M62.81);Pain Pain - Right/Left: Right Pain - part of body: Hip     Time: 1420-1511 PT Time Calculation (min) (ACUTE ONLY): 51 min  Charges:  $Gait Training: 23-37 mins $Therapeutic Exercise: 8-22 mins                    [name], [place] [facility] [phone] 05/08/2018    [name] 05/08/2018, 3:47 PM

## 2018-05-08 NOTE — Plan of Care (Signed)

## 2018-05-08 NOTE — Progress Notes (Signed)
PATIENT ID: Karina [name]        MRN:  [id number]          DOB/AGE: 75/14/1942 / 75 y.o.    Karina Fears, MD   Biagio Borg, PA-C [address] [place], [place]  [address]                             [phone]   PROGRESS NOTE  Subjective:  negative for Chest Pain  negative for Shortness of Breath  negative for Nausea/Vomiting   negative for Calf Pain    Tolerating Diet: yes         Patient reports pain as mild.     Comfortable night  Objective: Vital signs in last 24 hours:    Patient Vitals for the past 24 hrs:  BP Temp Temp src Pulse Resp SpO2  05/08/18 0450 126/72 98.2 F (36.8 C) Oral 63 16 98 %  05/08/18 0427 - 99.9 F (37.7 C) Oral - - -  05/08/18 0421 (!) 98/58 - - 77 16 100 %  05/08/18 0049 96/62 98.8 F (37.1 C) Oral 78 14 98 %  05/07/18 2148 (!) 88/56 98.1 F (36.7 C) Oral 78 18 100 %  05/07/18 2107 - - - - - 95 %  05/07/18 2104 (!) 87/58 98.2 F (36.8 C) Oral 75 - (!) 85 %  05/07/18 1620 90/61 (!) 97.4 F (36.3 C) Oral 73 17 93 %  05/07/18 1525 - (!) 97.3 F (36.3 C) - - - -  05/07/18 1520 - - - 68 18 98 %  05/07/18 1515 - - - 69 17 98 %  05/07/18 1500 - - - 70 15 99 %  05/07/18 1446 97/62 - - 72 12 99 %  05/07/18 1445 - - - 71 18 98 %  05/07/18 1430 - - - 67 17 99 %  05/07/18 1416 95/63 - - 67 15 99 %  05/07/18 1415 - - - 67 16 98 %  05/07/18 1401 109/72 - - - 16 -  05/07/18 1400 - - - - 16 -  05/07/18 1346 96/72 - - 67 15 93 %  05/07/18 1345 - - - 68 18 96 %  05/07/18 1331 99/64 - - 67 15 98 %  05/07/18 1330 - - - 66 15 97 %  05/07/18 1316 95/61 - - 67 14 97 %  05/07/18 1315 - - - 68 16 97 %  05/07/18 1301 92/63 - - 70 13 98 %  05/07/18 1300 - - - 70 14 97 %  05/07/18 1246 109/70 - - 66 12 100 %  05/07/18 1245 129/62 - - - 14 -  05/07/18 1233 (!) 85/62 - - 74 18 99 %  05/07/18 1230 - - - 68 13 99 %  05/07/18 1218 91/70 - - 69 12 100 %  05/07/18 1216 (!) 81/60 - - 68 15 99 %  05/07/18 1215 - - - 69 11 99 %  05/07/18 1205 (!)  89/58 - - 66 10 98 %  05/07/18 1200 - - - 72 17 99 %  05/07/18 1146 - - - 75 13 99 %  05/07/18 1145 93/63 - - 72 14 99 %  05/07/18 1131 - - - 69 (!) 22 94 %  05/07/18 1130 94/83 - - 66 10 98 %  05/07/18 1116 100/66 - - 70 16 94 %  05/07/18 1115 - - - 71 15 96 %  05/07/18 1101 98/60 - - 71 (!) 23 95 %  05/07/18 1100 - - - 71 15 95 %  05/07/18 1047 97/64 - - 72 18 99 %  05/07/18 1046 (!) 86/70 - - 73 18 97 %  05/07/18 1045 - - - 73 18 98 %  05/07/18 1038 97/64 - - 73 17 99 %  05/07/18 1031 (!) 88/63 - - 69 16 96 %  05/07/18 1030 - - - 69 16 95 %  05/07/18 1016 (!) 85/61 - - 69 16 96 %  05/07/18 1015 - - - 69 17 96 %  05/07/18 1001 (!) 91/58 - - 72 16 100 %  05/07/18 1000 - - - 70 17 100 %  05/07/18 0948 - - - 71 15 100 %  05/07/18 0947 (!) 96/57 (!) 97.2 F (36.2 C) - 72 14 100 %      Intake/Output from previous day:   10/15 0701 - 10/16 0700 In: 3040 [P.O.:440; I.V.:2150] Out: 1725 [Urine:1425]   Intake/Output this shift:   No intake/output data recorded.   Intake/Output      10/15 0701 - 10/16 0700 10/16 0701 - 10/17 0700   P.O. 440    I.V. 2150    Other 250    IV Piggyback 200    Total Intake 3040    Urine 1425    Blood 300    Total Output 1725    Net +1315            LABORATORY DATA: Recent Labs    05/08/18 0210  WBC 8.2  HGB 8.1*  HCT 25.4*  PLT 189   Recent Labs    05/08/18 0210  NA 136  K 3.4*  CL 101  CO2 26  BUN 18  CREATININE 0.79  GLUCOSE 129*  CALCIUM 8.4*   Lab Results  Component Value Date   INR 1.01 04/26/2018    Recent Radiographic Studies :  Dg Hip Port Unilat With Pelvis 1v Right  Result Date: 05/07/2018 CLINICAL DATA:  Hip replacement EXAM: DG HIP (WITH OR WITHOUT PELVIS) 1V PORT RIGHT COMPARISON:  None. FINDINGS: Located total right hip arthroplasty. The tip of the femoral stem is not visible. No evidence of periprosthetic fracture. Expected soft tissue gas and swelling. IMPRESSION: 1. Total right hip arthroplasty without  complicating feature. 2. The tip of the femoral stem is not seen on the AP view. Electronically Signed   By: Monte Fantasia M.D.   On: 05/07/2018 11:12     Examination:  General appearance: alert, cooperative and no distress  Wound Exam: clean, dry, intact   Drainage:  None: wound tissue dry  Motor Exam: EHL, FHL, Anterior Tibial and Posterior Tibial Intact  Sensory Exam: Superficial Peroneal, Deep Peroneal and Tibial normal  Vascular Exam: Normal  Assessment:    1 Day Post-Op  Procedure(s) (LRB): RIGHT TOTAL HIP ARTHROPLASTY (Right)  ADDITIONAL DIAGNOSIS:  Active Problems:   Primary osteoarthritis of right hip   Status post total replacement of right hip  Acute Blood Loss Anemia-VS stable,asymptomatic-monitor   Plan: Physical Therapy as ordered Weight Bearing as Tolerated (WBAT)  DVT Prophylaxis:  Aspirin, Foot Pumps and TED hose  DISCHARGE PLAN: Home  DISCHARGE NEEDS: HHPT, Walker and 3-in-1 comode seat  D/c foley, OOB with PT, saline lock IV, hope for D/C tomorrow     Biagio Borg, PA-C [place]  05/08/2018 7:41 AM  Patient ID: Karina [name], female   DOB: 05-Apr-1941, 77 y.o.   MRN:  [id number]  

## 2018-05-08 NOTE — Progress Notes (Signed)
Physical Therapy Treatment Patient Details Name: Karina [name] MRN: [id number] DOB: 04/18/1941 Today's Date: 05/08/2018    History of Present Illness Pt is a 77 y/o female s/p elective R THA, posterior approach. PMH includes HTN, melanoma, and osteoporosis.     PT Comments    Patient progressing with ambulation distance and mobility.  Still rather slow and easily fatigued. Reports will have initial 24 hour assist first week she is home.  Will continue skilled PT to progress to goals prior to d/c home with HHPT.   Follow Up Recommendations  Follow surgeon's recommendation for DC plan and follow-up therapies;Supervision for mobility/OOB     Equipment Recommendations  3in1 (PT)    Recommendations for Other Services       Precautions / Restrictions Precautions Precautions: Posterior Hip Precaution Comments: Reviewed posterior hip precautions.  Required Braces or Orthoses: Knee Immobilizer - Right Knee Immobilizer - Right: Other (comment)(when in bed) Restrictions RLE Weight Bearing: Weight bearing as tolerated    Mobility  Bed Mobility Overal bed mobility: Needs Assistance Bed Mobility: Sit to Supine       Sit to supine: Min assist   General bed mobility comments: for RLE  Transfers Overall transfer level: Needs assistance Equipment used: Rolling walker (2 wheeled) Transfers: Sit to/from Stand Sit to Stand: Supervision;Min guard         General transfer comment: cues for hand placement  Ambulation/Gait Ambulation/Gait assistance: Supervision;Min guard Gait Distance (Feet): 46 Feet Assistive device: Rolling walker (2 wheeled) Gait Pattern/deviations: Step-to pattern;Decreased stride length;Antalgic     General Gait Details: cues for positioning in walker and safety; need youth walker insteady due to shoulder pain    Stairs             Wheelchair Mobility    Modified Rankin (Stroke Patients Only)       Balance Overall balance assessment: Needs  assistance   Sitting balance-Leahy Scale: Good     Standing balance support: Bilateral upper extremity supported;During functional activity Standing balance-Leahy Scale: Poor Standing balance comment: UE support for balance or leans on sink to wash hands                            Cognition Arousal/Alertness: Awake/alert Behavior During Therapy: WFL for tasks assessed/performed Overall Cognitive Status: Within Functional Limits for tasks assessed                                        Exercises      General Comments        Pertinent Vitals/Pain Faces Pain Scale: Hurts whole lot Pain Location: R hip  Pain Descriptors / Indicators: Aching;Operative site guarding Pain Intervention(s): Monitored during session;Repositioned;Ice applied    Home Living                      Prior Function            PT Goals (current goals can now be found in the care plan section) Progress towards PT goals: Progressing toward goals    Frequency    7X/week      PT Plan Current plan remains appropriate    Co-evaluation              AM-PAC PT "6 Clicks" Daily Activity  Outcome Measure  Difficulty turning over in bed (including adjusting bedclothes,  sheets and blankets)?: A Lot Difficulty moving from lying on back to sitting on the side of the bed? : Unable Difficulty sitting down on and standing up from a chair with arms (e.g., wheelchair, bedside commode, etc,.)?: Unable Help needed moving to and from a bed to chair (including a wheelchair)?: A Little Help needed walking in hospital room?: A Little Help needed climbing 3-5 steps with a railing? : A Lot 6 Click Score: 12    End of Session Equipment Utilized During Treatment: Gait belt Activity Tolerance: Patient tolerated treatment well Patient left: in bed;with call bell/phone within reach   PT Visit Diagnosis: Unsteadiness on feet (R26.81);Other abnormalities of gait and mobility  (R26.89);Muscle weakness (generalized) (M62.81);Pain Pain - Right/Left: Right Pain - part of body: Hip     Time: 9628-3662 PT Time Calculation (min) (ACUTE ONLY): 28 min  Charges:  $Gait Training: 8-22 mins $Therapeutic Activity: 8-22 mins                     Karina [name], PT Acute Rehabilitation Services [phone] 05/08/2018    Karina [name] 05/08/2018, 2:08 PM

## 2018-05-09 LAB — CBC
HEMATOCRIT: 23.8 % — AB (ref 36.0–46.0)
Hemoglobin: 7.4 g/dL — ABNORMAL LOW (ref 12.0–15.0)
MCH: 28.4 pg (ref 26.0–34.0)
MCHC: 31.1 g/dL (ref 30.0–36.0)
MCV: 91.2 fL (ref 80.0–100.0)
PLATELETS: 175 10*3/uL (ref 150–400)
RBC: 2.61 MIL/uL — ABNORMAL LOW (ref 3.87–5.11)
RDW: 13.5 % (ref 11.5–15.5)
WBC: 7.4 10*3/uL (ref 4.0–10.5)
nRBC: 0.3 % — ABNORMAL HIGH (ref 0.0–0.2)

## 2018-05-09 LAB — BASIC METABOLIC PANEL
ANION GAP: 9 (ref 5–15)
BUN: 11 mg/dL (ref 8–23)
CALCIUM: 8 mg/dL — AB (ref 8.9–10.3)
CO2: 25 mmol/L (ref 22–32)
CREATININE: 0.69 mg/dL (ref 0.44–1.00)
Chloride: 102 mmol/L (ref 98–111)
Glucose, Bld: 129 mg/dL — ABNORMAL HIGH (ref 70–99)
Potassium: 2.9 mmol/L — ABNORMAL LOW (ref 3.5–5.1)
SODIUM: 136 mmol/L (ref 135–145)

## 2018-05-09 MED ORDER — OXYCODONE HCL 5 MG PO TABS: 5.0000 mg | ORAL_TABLET | ORAL | 0 refills | Status: DC | PRN

## 2018-05-09 MED ORDER — METHOCARBAMOL 500 MG PO TABS: 500.0000 mg | ORAL_TABLET | Freq: Three times a day (TID) | ORAL | 0 refills | Status: DC | PRN

## 2018-05-09 MED ORDER — POTASSIUM CHLORIDE CRYS ER 20 MEQ PO TBCR
30.0000 meq | EXTENDED_RELEASE_TABLET | Freq: Two times a day (BID) | ORAL | Status: DC
  Administered 2018-05-09: 30 meq via ORAL
  Filled 2018-05-09: qty 1

## 2018-05-09 MED ORDER — ASPIRIN 81 MG PO CHEW: 81.0000 mg | CHEWABLE_TABLET | Freq: Two times a day (BID) | ORAL | Status: AC

## 2018-05-09 MED ORDER — ACETAMINOPHEN 325 MG PO TABS: 650.0000 mg | ORAL_TABLET | Freq: Four times a day (QID) | ORAL | Status: DC

## 2018-05-09 NOTE — Progress Notes (Signed)
Physical Therapy Treatment Patient Details Name: Karina [name] MRN: [id number] DOB: Dec 09, 1940 Today's Date: 05/09/2018    History of Present Illness Pt is a 77 y/o female s/p elective R THA, posterior approach. PMH includes HTN, melanoma, and osteoporosis.     PT Comments    Patient is making good progress with PT.  From a mobility standpoint anticipate patient will be ready for DC home when medically ready.    Follow Up Recommendations  Follow surgeon's recommendation for DC plan and follow-up therapies;Supervision for mobility/OOB     Equipment Recommendations  3in1 (PT)    Recommendations for Other Services OT consult     Precautions / Restrictions Precautions Precautions: Posterior Hip Precaution Comments: pt able to recall 1/3 precuations and 3/3 precautions reviewed with pt; pt has handout in room already Required Braces or Orthoses: Knee Immobilizer - Right Knee Immobilizer - Right: Other (comment)(in bed) Restrictions Weight Bearing Restrictions: Yes RLE Weight Bearing: Weight bearing as tolerated    Mobility  Bed Mobility               General bed mobility comments: pt OOB in chair upon arrival  Transfers Overall transfer level: Needs assistance Equipment used: Rolling walker (2 wheeled) Transfers: Sit to/from Stand Sit to Stand: Min guard         General transfer comment: cues for hand placement  Ambulation/Gait Ambulation/Gait assistance: Supervision Gait Distance (Feet): 150 Feet Assistive device: Rolling walker (2 wheeled) Gait Pattern/deviations: Step-to pattern;Step-through pattern;Decreased stance time - right;Decreased step length - left;Decreased weight shift to right Gait velocity: Decreased    General Gait Details: cues for step length symmetry, sequencing, and cues for hip precautiosn with turning    Stairs             Wheelchair Mobility    Modified Rankin (Stroke Patients Only)       Balance Overall balance  assessment: Needs assistance Sitting-balance support: No upper extremity supported;Feet supported Sitting balance-Leahy Scale: Good     Standing balance support: Bilateral upper extremity supported;During functional activity Standing balance-Leahy Scale: Poor                              Cognition Arousal/Alertness: Awake/alert Behavior During Therapy: WFL for tasks assessed/performed Overall Cognitive Status: Within Functional Limits for tasks assessed                                        Exercises      General Comments General comments (skin integrity, edema, etc.): pt educated on car transfer and demonstrated in room       Pertinent Vitals/Pain Pain Assessment: Faces Faces Pain Scale: Hurts little more Pain Location: R hip  Pain Descriptors / Indicators: Operative site guarding;Sore Pain Intervention(s): Limited activity within patient's tolerance;Monitored during session;Premedicated before session;Repositioned    Home Living                      Prior Function            PT Goals (current goals can now be found in the care plan section) Acute Rehab PT Goals Patient Stated Goal: to get back to normal  Progress towards PT goals: Progressing toward goals    Frequency    7X/week      PT Plan Current plan remains appropriate  Co-evaluation              AM-PAC PT "6 Clicks" Daily Activity  Outcome Measure  Difficulty turning over in bed (including adjusting bedclothes, sheets and blankets)?: A Lot Difficulty moving from lying on back to sitting on the side of the bed? : Unable Difficulty sitting down on and standing up from a chair with arms (e.g., wheelchair, bedside commode, etc,.)?: Unable Help needed moving to and from a bed to chair (including a wheelchair)?: A Little Help needed walking in hospital room?: A Little Help needed climbing 3-5 steps with a railing? : A Little 6 Click Score: 13    End of  Session Equipment Utilized During Treatment: Gait belt Activity Tolerance: Patient tolerated treatment well Patient left: with call bell/phone within reach;in chair Nurse Communication: Mobility status PT Visit Diagnosis: Unsteadiness on feet (R26.81);Other abnormalities of gait and mobility (R26.89);Muscle weakness (generalized) (M62.81);Pain Pain - Right/Left: Right Pain - part of body: Hip     Time: 1131-1205 PT Time Calculation (min) (ACUTE ONLY): 34 min  Charges:  $Gait Training: 23-37 mins                     Earney Navy, PTA Acute Rehabilitation Services Pager: [phone] Office: [phone]     Darliss Cheney 05/09/2018, 4:39 PM

## 2018-05-09 NOTE — Discharge Summary (Addendum)
Karina Fears, MD   Biagio Borg, PA-C [address], [place], [place]  [address]                             [phone]  PATIENT ID: Karina [name]        MRN:  [id number]          DOB/AGE: August 19, 1940 / 77 y.o.    DISCHARGE SUMMARY  ADMISSION DATE:    05/07/2018 DISCHARGE DATE:   05/09/2018   ADMISSION DIAGNOSIS: right hip osteoarthritis    DISCHARGE DIAGNOSIS:  right hip osteoarthritis    ADDITIONAL DIAGNOSIS: Active Problems:   Primary osteoarthritis of right hip   Status post total replacement of right hip  Past Medical History:  Diagnosis Date  . Complication of anesthesia   . Diverticulosis   . Family history of adverse reaction to anesthesia    sister-N/V  . GERD (gastroesophageal reflux disease)   . Hiatal hernia   . HTN (hypertension)   . Hypercholesterolemia   . Hypothyroidism   . Melanoma ([place]) 1978   chemo  . Osteoporosis   . Panic attacks   . PONV (postoperative nausea and vomiting)     PROCEDURE: Procedure(s): RIGHT TOTAL HIP ARTHROPLASTY  on 05/07/2018  CONSULTS: none    HISTORY: Karina [name], 77 y.o. female, has a history of pain and functional disability in the right hip(s) due to arthritis and patient has failed non-surgical conservative treatments for greater than 12 weeks to include NSAID's and/or analgesics, corticosteriod injections, use of assistive devices, weight reduction as appropriate and activity modification.  Onset of symptoms was gradual starting 9 years ago with gradually worsening course since that time.The patient noted no past surgery on the right hip(s).  Patient currently rates pain in the right hip at 9 out of 10 with activity. Patient has night pain, worsening of pain with activity and weight bearing, trendelenberg gait, pain that interfers with activities of daily living, pain with passive range of motion and crepitus. Patient has evidence of subchondral cysts, subchondral sclerosis, periarticular osteophytes and joint  space narrowing by imaging studies. This condition presents safety issues increasing the risk of falls.There is no current active infection.  HOSPITAL COURSE:  Karina [name] is a 77 y.o. admitted on 05/07/2018 and found to have a diagnosis of right hip osteoarthritis.  After appropriate laboratory studies were obtained  they were taken to the operating room on 05/07/2018 and underwent  Procedure(s): RIGHT TOTAL HIP ARTHROPLASTY  .   They were given perioperative antibiotics:  Anti-infectives (From admission, onward)   Start     Dose/Rate Route Frequency Ordered Stop   05/07/18 1630  ceFAZolin (ANCEF) IVPB 2g/100 mL premix     2 g 200 mL/hr over 30 Minutes Intravenous Every 6 hours 05/07/18 1543 05/08/18 0000   05/07/18 0600  ceFAZolin (ANCEF) IVPB 2g/100 mL premix     2 g 200 mL/hr over 30 Minutes Intravenous On call to O.R. 05/07/18 0867 05/07/18 0727   05/07/18 0555  ceFAZolin (ANCEF) 2-4 GM/100ML-% IVPB    Note to Pharmacy:  Karina [name]   : cabinet override      05/07/18 0555 05/07/18 0727    .  Tolerated the procedure well.  Placed with a foley intraoperatively.    Toradol was given post op.  POD #1, allowed out of bed to a chair.  PT for ambulation and exercise program.  Foley D/C'd in morning.  IV saline  locked.  O2 discontionued.  POD #2, continued PT and ambulation.  Potassium was 2.9, given 30mg  bid today, Doing well and would like to be discharged to home .  The remainder of the hospital course was dedicated to ambulation and strengthening.   The patient was discharged on 2 Days Post-Op in  Stable condition.  Blood products given:none  DIAGNOSTIC STUDIES: Recent vital signs:  Patient Vitals for the past 24 hrs:  BP Temp Temp src Pulse Resp SpO2  05/09/18 0833 107/74 - - - - -  05/09/18 0350 (!) 97/55 99.9 F (37.7 C) Oral 95 18 92 %  05/08/18 2300 (!) 94/54 - - - - -  05/08/18 2020 (!) 90/52 - - - - -  05/08/18 1942 (!) 79/54 98.5 F (36.9 C) Oral 89 20 92 %    05/08/18 1542 93/60 99.9 F (37.7 C) Oral 95 14 96 %       Recent laboratory studies: Recent Labs    05/08/18 0210 05/09/18 0224  WBC 8.2 7.4  HGB 8.1* 7.4*  HCT 25.4* 23.8*  PLT 189 175   Recent Labs    05/08/18 0210 05/09/18 0224  NA 136 136  K 3.4* 2.9*  CL 101 102  CO2 26 25  BUN 18 11  CREATININE 0.79 0.69  GLUCOSE 129* 129*  CALCIUM 8.4* 8.0*   Lab Results  Component Value Date   INR 1.01 04/26/2018     Recent Radiographic Studies :  Dg Hip Port Unilat With Pelvis 1v Right  Result Date: 05/07/2018 CLINICAL DATA:  Hip replacement EXAM: DG HIP (WITH OR WITHOUT PELVIS) 1V PORT RIGHT COMPARISON:  None. FINDINGS: Located total right hip arthroplasty. The tip of the femoral stem is not visible. No evidence of periprosthetic fracture. Expected soft tissue gas and swelling. IMPRESSION: 1. Total right hip arthroplasty without complicating feature. 2. The tip of the femoral stem is not seen on the AP view. Electronically Signed   By: Monte Fantasia M.D.   On: 05/07/2018 11:12    DISCHARGE INSTRUCTIONS: Discharge Instructions    Call MD / Call 911   Complete by:  As directed    If you experience chest pain or shortness of breath, CALL 911 and be transported to the hospital emergency room.  If you develope a fever above 101 F, pus (white drainage) or increased drainage or redness at the wound, or calf pain, call your surgeon's office.   Change dressing   Complete by:  As directed    DO NOT CHANGE DRESSING. KEEP ON TILL SEEN IN THE OFFICE   Constipation Prevention   Complete by:  As directed    Drink plenty of fluids.  Prune juice may be helpful.  You may use a stool softener, such as Colace (over the counter) 100 mg twice a day.  Use MiraLax (over the counter) for constipation as needed.   Diet general   Complete by:  As directed    Discharge instructions   Complete by:  As directed    [place] items at home which could result  in a fall. This includes throw rugs or furniture in walking pathways ICE to the affected joint every three hours while awake for 30 minutes at a time, for at least the first 3-5 days, and then as needed for pain and swelling.  Continue to use ice for pain and swelling. You may notice swelling that will progress down to the foot and ankle.  This is normal after surgery.  Elevate your leg when you are not up walking on it.   Continue to use the breathing machine you got in the hospital (incentive spirometer) which will help keep your temperature down.  It is common for your temperature to cycle up and down following surgery, especially at night when you are not up moving around and exerting yourself.  The breathing machine keeps your lungs expanded and your temperature down.   DIET:  As you were doing prior to hospitalization, we recommend a well-balanced diet.  DRESSING / WOUND CARE / SHOWERING  Keep the surgical dressing until follow up.  The dressing is water proof, so you can shower without any extra covering.  IF THE DRESSING FALLS OFF or the wound gets wet inside, change the dressing with sterile gauze.  Please use good hand washing techniques before changing the dressing.  Do not use any lotions or creams on the incision until instructed by your surgeon.    ACTIVITY  Increase activity slowly as tolerated, but follow the weight bearing instructions below.   No driving for 6 weeks or until further direction given by your physician.  You cannot drive while taking narcotics.  No lifting or carrying greater than 10 lbs. until further directed by your surgeon. Avoid periods of inactivity such as sitting longer than an hour when not asleep. This helps prevent blood clots.  You may return to work once you are authorized by your doctor.     WEIGHT BEARING   Weight bearing as tolerated with assist device (walker, cane, etc) as directed, use it as long as suggested by your surgeon or therapist,  typically at least 4-6 weeks.   EXERCISES  Results after joint replacement surgery are often greatly improved when you follow the exercise, range of motion and muscle strengthening exercises prescribed by your doctor. Safety measures are also important to protect the joint from further injury. Any time any of these exercises cause you to have increased pain or swelling, decrease what you are doing until you are comfortable again and then slowly increase them. If you have problems or questions, call your caregiver or physical therapist for advice.   Rehabilitation is important following a joint replacement. After just a few days of immobilization, the muscles of the leg can become weakened and shrink (atrophy).  These exercises are designed to build up the tone and strength of the thigh and leg muscles and to improve motion. Often times heat used for twenty to thirty minutes before working out will loosen up your tissues and help with improving the range of motion but do not use heat for the first two weeks following surgery (sometimes heat can increase post-operative swelling).   These exercises can be done on a training (exercise) mat, on a table or on a bed. Use whatever works the best and is most comfortable for you.    Use music or television while you are exercising so that the exercises are a pleasant break in your day. This will make your life better with the exercises acting as a break in your routine that you can look forward to.   Perform all exercises about fifteen times, three times per day or as directed.  You should exercise both the operative leg and the other leg as well.   Exercises include:  Quad Sets - Tighten up the muscle on the front of the thigh (Quad) and hold for 5-10 seconds.   Straight Leg Raises -  With your knee straight (if you were given a brace, keep it on), lift the leg to 60 degrees, hold for 3 seconds, and slowly lower the leg.  Perform this exercise against resistance  later as your leg gets stronger.  Leg Slides: Lying on your back, slowly slide your foot toward your buttocks, bending your knee up off the floor (only go as far as is comfortable). Then slowly slide your foot back down until your leg is flat on the floor again.  Angel Wings: Lying on your back spread your legs to the side as far apart as you can without causing discomfort.  Hamstring Strength:  Lying on your back, push your heel against the floor with your leg straight by tightening up the muscles of your buttocks.  Repeat, but this time bend your knee to a comfortable angle, and push your heel against the floor.  You may put a pillow under the heel to make it more comfortable if necessary.   A rehabilitation program following joint replacement surgery can speed recovery and prevent re-injury in the future due to weakened muscles. Contact your doctor or a physical therapist for more information on knee rehabilitation.    CONSTIPATION  Constipation is defined medically as fewer than three stools per week and severe constipation as less than one stool per week.  Even if you have a regular bowel pattern at home, your normal regimen is likely to be disrupted due to multiple reasons following surgery.  Combination of anesthesia, postoperative narcotics, change in appetite and fluid intake all can affect your bowels.   YOU MUST use at least one of the following options; they are listed in order of increasing strength to get the job done.  They are all available over the counter, and you may need to use some, POSSIBLY even all of these options:    Drink plenty of fluids (prune juice may be helpful) and high fiber foods Colace 100 mg by mouth twice a day  Senokot for constipation as directed and as needed Dulcolax (bisacodyl), take with full glass of water  Miralax (polyethylene glycol) once or twice a day as needed.  If you have tried all these things and are unable to have a bowel movement in the first  3-4 days after surgery call either your surgeon or your primary doctor.    If you experience loose stools or diarrhea, hold the medications until you stool forms back up.  If your symptoms do not get better within 1 week or if they get worse, check with your doctor.  If you experience "the worst abdominal pain ever" or develop nausea or vomiting, please contact the office immediately for further recommendations for treatment.   ITCHING:  If you experience itching with your medications, try taking only a single pain pill, or even half a pain pill at a time.  You can also use Benadryl over the counter for itching or also to help with sleep.   TED HOSE STOCKINGS:  Use stockings on both legs until for at least 2 weeks or as directed by physician office. They may be removed at night for sleeping.  MEDICATIONS:  See your medication summary on the "After Visit Summary" that nursing will review with you.  You may have some home medications which will be placed on hold until you complete the course of blood thinner medication.  It is important for you to complete the blood thinner medication as prescribed.  PRECAUTIONS:  If you experience chest  pain or shortness of breath - call 911 immediately for transfer to the hospital emergency department.   If you develop a fever greater that 101 F, purulent drainage from wound, increased redness or drainage from wound, foul odor from the wound/dressing, or calf pain - CONTACT YOUR SURGEON.                                                   FOLLOW-UP APPOINTMENTS:  If you do not already have a post-op appointment, please call the office for an appointment to be seen by your surgeon.  Guidelines for how soon to be seen are listed in your "After Visit Summary", but are typically between 1-4 weeks after surgery.  OTHER INSTRUCTIONS:   Knee Replacement:  Do not place pillow under knee, focus on keeping the knee straight while resting. CPM instructions: 0-90 degrees, 2  hours in the morning, 2 hours in the afternoon, and 2 hours in the evening. Place foam block, curve side up under heel at all times except when in CPM or when walking.  DO NOT modify, tear, cut, or change the foam block in any way.  MAKE SURE YOU:  Understand these instructions.  Get help right away if you are not doing well or get worse.    Thank you for letting us be a part of your medical care team.  It is a privilege we respect greatly.  We hope these instructions will help you stay on track for a fast and full recovery!   Driving restrictions   Complete by:  As directed    No driving for 6 weeks   Follow the hip precautions as taught in Physical Therapy   Complete by:  As directed    Increase activity slowly as tolerated   Complete by:  As directed    Lifting restrictions   Complete by:  As directed    No lifting for 6 weeks   Patient may shower   Complete by:  As directed    [place].  DO NOT REMOVE DRESSING   TED hose   Complete by:  As directed    Use stockings (TED hose) for 2 weeks on RIGHT leg(s).  You may remove them at night for sleeping.   Weight bearing as tolerated   Complete by:  As directed    Laterality:  right   Extremity:  Lower      DISCHARGE MEDICATIONS:   Allergies as of 05/09/2018      Reactions   Morphine Itching   Propoxyphene Hcl Itching      Medication List    STOP taking these medications   aspirin EC 81 MG tablet Replaced by:  aspirin 81 MG chewable tablet   celecoxib 200 MG capsule Commonly known as:  CELEBREX   HYDROcodone-acetaminophen 5-325 MG tablet Commonly known as:  NORCO/VICODIN   oxyCODONE-acetaminophen 5-325 MG tablet Commonly known as:  PERCOCET/ROXICET   traMADol 50 MG tablet Commonly known as:  ULTRAM     TAKE these medications   acetaminophen 325 MG tablet Commonly known as:  TYLENOL Take 2 tablets (650 mg total) by mouth every 6 (six) hours. What changed:    how much to take  when  to take this  reasons to take this   ALPRAZolam 0.5 MG tablet Commonly known as:  XANAX Take 0.5 mg by mouth 2 (two) times daily as needed for anxiety.   aspirin 81 MG chewable tablet Chew 1 tablet (81 mg total) by mouth 2 (two) times daily. Replaces:  aspirin EC 81 MG tablet   atenolol 50 MG tablet Commonly known as:  TENORMIN Take 50 mg by mouth daily.   CALCIUM 600 600 MG Tabs tablet Generic drug:  calcium carbonate Take by mouth.   ergocalciferol 50000 units capsule Commonly known as:  VITAMIN D2 Take 50,000 Units by mouth 2 (two) times a week.   methocarbamol 500 MG tablet Commonly known as:  ROBAXIN Take 1 tablet (500 mg total) by mouth every 8 (eight) hours as needed for muscle spasms.   olmesartan-hydrochlorothiazide 20-12.5 MG tablet Commonly known as:  BENICAR HCT Take 1 tablet by mouth daily.   oxyCODONE 5 MG immediate release tablet Commonly known as:  Oxy IR/ROXICODONE Take 1 tablet (5 mg total) by mouth every 3 (three) hours as needed for moderate pain (pain score 4-6).   risedronate 150 MG tablet Commonly known as:  ACTONEL Take 150 mg by mouth every 30 (thirty) days.   sertraline 100 MG tablet Commonly known as:  ZOLOFT Take 100 mg by mouth every morning.   SYNTHROID 75 MCG tablet Generic drug:  levothyroxine Take 75 mcg by mouth daily before breakfast.   URIBEL 118 MG Caps Take 118 mg by mouth daily as needed (for bladder).   ZETIA 10 MG tablet Generic drug:  ezetimibe Take 10 mg by mouth at bedtime.            Durable Medical Equipment  (From admission, onward)         Start     Ordered   05/07/18 1544  DME Walker rolling  Once    Question:  Patient needs a walker to treat with the following condition  Answer:  S/P total hip arthroplasty   05/07/18 1543   05/07/18 1544  DME 3 n 1  Once     05/07/18 1543   05/07/18 1544  DME Bedside commode  Once    Question:  Patient needs a bedside commode to treat with the following condition   Answer:  Status post total replacement of right hip   05/07/18 1543           Discharge Care Instructions  (From admission, onward)         Start     Ordered   05/09/18 0000  Weight bearing as tolerated    Question Answer Comment  Laterality right   Extremity Lower      05/09/18 0934   05/09/18 0000  Change dressing    Comments:  DO NOT CHANGE DRESSING. KEEP ON TILL SEEN IN THE OFFICE   05/09/18 0934          FOLLOW UP VISIT:   Follow-up Information    Garald Balding, MD Follow up on 05/20/2018.   Specialty:  Orthopedic Surgery Contact information: [address] [place] [address] [phone]          Instructed in eating 1-2 bananas a day for the next week to replenish KCL  DISPOSITION:   Home  CONDITION:  Stable   Mike Craze. [place], [place] [phone]  05/09/2018 9:44 AM

## 2018-05-09 NOTE — Care Management Note (Signed)
Case Management Note  Patient Details  Name: Karina [name] MRN: [id number] Date of Birth: 12-08-1940  Subjective/Objective:   S/p right THA                 Action/Plan: Spoke to pt at bedside. Offered choice for [facility]. Agreeable to [facility] for [facility]. (preoperatively arranged by surgeon's office). Requested RW youth and 3n1 bedside commode. Contacted [facility] for DME for home to be delivered to room prior to dc. States her sister, and adult children will assist her at home.   Expected Discharge Date:  05/09/18               Expected Discharge Plan:  [place]  In-House Referral:  NA  Discharge planning Services     Post Acute Care Choice:  Home Health Choice offered to:  Patient  DME Arranged:  3-N-1, Walker rolling DME Agency:  [place]:  PT [place] Agency:  [facility] ([facility])  Status of Service:  Completed, signed off  If discussed at [employer], dates discussed:    Additional Comments:  Erenest Rasher, RN 05/09/2018, 10:14 AM

## 2018-05-09 NOTE — Progress Notes (Signed)
Occupational Therapy Treatment Patient Details Name: Karina [name] MRN: [id number] DOB: [birth year] Today's Date: 05/09/2018    History of present illness Pt is a 77 y/o female s/p elective R THA, posterior approach. PMH includes HTN, melanoma, and osteoporosis.    OT comments  Pt progressing towards OT goals this session. Provided AE kit and practiced with all equipment (see ADL section below) as well as performed toilet transfer - increased time at min guard; and peri care. Continue to recommend [place].    Follow Up Recommendations  Home health OT;Follow surgeon's recommendation for DC plan and follow-up therapies    Equipment Recommendations  3 in 1 bedside commode;Other (comment)(AE for LB ADL - provided)    Recommendations for Other Services      Precautions / Restrictions Precautions Precautions: Posterior Hip Precaution Booklet Issued: Yes (comment) Precaution Comments: Reviewed posterior hip precautions.  Required Braces or Orthoses: Knee Immobilizer - Right Knee Immobilizer - Right: Other (comment)(in bed) Restrictions Weight Bearing Restrictions: Yes RLE Weight Bearing: Weight bearing as tolerated Other Position/Activity Restrictions: posterior hip precautions       Mobility Bed Mobility Overal bed mobility: Needs Assistance Bed Mobility: Supine to Sit     Supine to sit: Min guard     General bed mobility comments: use of bed rail to assist  Transfers Overall transfer level: Needs assistance Equipment used: Rolling walker (2 wheeled) Transfers: Sit to/from Stand Sit to Stand: Min guard         General transfer comment: cues for hand placement    Balance Overall balance assessment: Needs assistance Sitting-balance support: No upper extremity supported;Feet supported Sitting balance-Leahy Scale: Good     Standing balance support: Bilateral upper extremity supported;During functional activity Standing balance-Leahy Scale: Poor Standing balance  comment: UE support for balance or leans on sink to wash hands                           ADL either performed or assessed with clinical judgement   ADL Overall ADL's : Needs assistance/impaired Eating/Feeding: Independent   Grooming: Min guard;Standing Grooming Details (indicate cue type and reason): sink level     Lower Body Bathing: Sitting/lateral leans;Min guard;Sit to/from stand;With adaptive equipment Lower Body Bathing Details (indicate cue type and reason): educated in use of long handle sponge     Lower Body Dressing: Set up;With adaptive equipment;Sit to/from stand Lower Body Dressing Details (indicate cue type and reason): educated in long handle shoe horn, sock donner, grabber/reacher to assist with LB dressing - Pt practiced and all pieces of equipment provided Toilet Transfer: Min guard;Ambulation;RW;Comfort height toilet;Grab bars   Toileting- Clothing Manipulation and Hygiene: Set up;Sitting/lateral lean Toileting - Clothing Manipulation Details (indicate cue type and reason): front peri care     Functional mobility during ADLs: Min guard;Rolling walker General ADL Comments: provided AE kit and practiced with each piece of equipment     Vision       Perception     Praxis      Cognition Arousal/Alertness: Awake/alert Behavior During Therapy: WFL for tasks assessed/performed Overall Cognitive Status: Within Functional Limits for tasks assessed                                          Exercises     Shoulder Instructions       General Comments  Pertinent Vitals/ Pain       Pain Assessment: 0-10 Pain Score: 3  Pain Location: R hip  Pain Descriptors / Indicators: Aching;Operative site guarding Pain Intervention(s): Repositioned;Limited activity within patient's tolerance;Monitored during session;Premedicated before session  Home Living                                          Prior  Functioning/Environment              Frequency  Min 3X/week        Progress Toward Goals  OT Goals(current goals can now be found in the care plan section)  Progress towards OT goals: Progressing toward goals  Acute Rehab OT Goals Patient Stated Goal: to get back to normal  OT Goal Formulation: With patient Time For Goal Achievement: 05/22/18 Potential to Achieve Goals: Good  Plan Discharge plan remains appropriate;Frequency remains appropriate    Co-evaluation                 AM-PAC PT "6 Clicks" Daily Activity     Outcome Measure   Help from another person eating meals?: None Help from another person taking care of personal grooming?: A Little Help from another person toileting, which includes using toliet, bedpan, or urinal?: A Little Help from another person bathing (including washing, rinsing, drying)?: A Lot Help from another person to put on and taking off regular upper body clothing?: A Little Help from another person to put on and taking off regular lower body clothing?: A Lot 6 Click Score: 17    End of Session Equipment Utilized During Treatment: Gait belt;Rolling walker  OT Visit Diagnosis: Unsteadiness on feet (R26.81);Other abnormalities of gait and mobility (R26.89);Pain Pain - Right/Left: Right Pain - part of body: Hip   Activity Tolerance Patient tolerated treatment well   Patient Left in chair;with call bell/phone within reach;with family/visitor present   Nurse Communication Mobility status;Precautions;Weight bearing status        Time: 1007-1030 OT Time Calculation (min): 23 min  Charges: OT General Charges $OT Visit: 1 Visit OT Treatments $Self Care/Home Management : 23-37 mins  Hulda Humphrey OTR/L Acute Rehabilitation Services Pager: [phone] Office: [place] 05/09/2018, 1:56 PM

## 2018-05-09 NOTE — Progress Notes (Signed)
PATIENT ID: Karina [name]        MRN:  [id number]          DOB/AGE: 75-24-1942 / 77 y.o.    [name], MD   Biagio Borg, PA-C [address] [place], [place]  [address]                             [phone]   PROGRESS NOTE  Subjective:  negative for Chest Pain  negative for Shortness of Breath  negative for Nausea/Vomiting   negative for Calf Pain    Tolerating Diet: yes         Patient reports pain as mild and moderate.     Comfortable   Objective: Vital signs in last 24 hours:    Patient Vitals for the past 24 hrs:  BP Temp Temp src Pulse Resp SpO2  05/09/18 0833 107/74 - - - - -  05/09/18 0350 (!) 97/55 99.9 F (37.7 C) Oral 95 18 92 %  05/08/18 2300 (!) 94/54 - - - - -  05/08/18 2020 (!) 90/52 - - - - -  05/08/18 1942 (!) 79/54 98.5 F (36.9 C) Oral 89 20 92 %  05/08/18 1542 93/60 99.9 F (37.7 C) Oral 95 14 96 %      Intake/Output from previous day:   10/16 0701 - 10/17 0700 In: 1560 [P.O.:1560] Out: 1 [Urine:1]   Intake/Output this shift:   No intake/output data recorded.   Intake/Output      10/16 0701 - 10/17 0700 10/17 0701 - 10/18 0700   P.O. 1560    I.V.     Other     IV Piggyback     Total Intake 1560    Urine 1    Blood     Total Output 1    Net +1559         Urine Occurrence 5 x       LABORATORY DATA: Recent Labs    05/08/18 0210 05/09/18 0224  WBC 8.2 7.4  HGB 8.1* 7.4*  HCT 25.4* 23.8*  PLT 189 175   Recent Labs    05/08/18 0210 05/09/18 0224  NA 136 136  K 3.4* 2.9*  CL 101 102  CO2 26 25  BUN 18 11  CREATININE 0.79 0.69  GLUCOSE 129* 129*  CALCIUM 8.4* 8.0*   Lab Results  Component Value Date   INR 1.01 04/26/2018    Recent Radiographic Studies :  Dg Hip Port Unilat With Pelvis 1v Right  Result Date: 05/07/2018 CLINICAL DATA:  Hip replacement EXAM: DG HIP (WITH OR WITHOUT PELVIS) 1V PORT RIGHT COMPARISON:  None. FINDINGS: Located total right hip arthroplasty. The tip of the femoral stem is not  visible. No evidence of periprosthetic fracture. Expected soft tissue gas and swelling. IMPRESSION: 1. Total right hip arthroplasty without complicating feature. 2. The tip of the femoral stem is not seen on the AP view. Electronically Signed   By: Monte Fantasia M.D.   On: 05/07/2018 11:12     Examination:  General appearance: alert, cooperative, mild distress and moderate distress Resp: clear to auscultation bilaterally Cardio: regular rate and rhythm GI: normal findings: bowel sounds normal  Wound Exam: clean, dry, intact   Drainage:  None: wound tissue dry  Motor Exam: EHL, FHL, Anterior Tibial and Posterior Tibial Intact  Sensory Exam: Superficial Peroneal, Deep Peroneal and Tibial normal  Vascular Exam: Right dorsalis pedis artery  has 1+ (weak) pulse  Assessment:    2 Days Post-Op  Procedure(s) (LRB): RIGHT TOTAL HIP ARTHROPLASTY (Right)  ADDITIONAL DIAGNOSIS:  Active Problems:   Primary osteoarthritis of right hip   Status post total replacement of right hip  Acute Blood Loss Anemia   Plan: Physical Therapy as ordered Weight Bearing as Tolerated (WBAT)  DVT Prophylaxis:  Aspirin, Foot Pumps and TED hose  DISCHARGE PLAN: Home today  DISCHARGE NEEDS: HHPT, Walker and 3-in-1 comode seat  Anticipated LOS equal to or greater than 2 midnights due to - Age 75 and older with one or more of the following:  - Obesity  - Expected need for hospital services (PT, OT, Nursing) required for safe  discharge  - Anticipated need for postoperative skilled nursing care or inpatient rehab  - Active co-morbidities: None OR   - Unanticipated findings during/Post Surgery: None  - Patient is a high risk of re-admission due to: None        Biagio Borg, PA-C [place]  05/09/2018 9:21 AM  Patient ID: Karina [name], female   DOB: Apr 01, 1941, 77 y.o.   MRN: [id number]

## 2018-05-10 ENCOUNTER — Telehealth (INDEPENDENT_AMBULATORY_CARE_PROVIDER_SITE_OTHER): Payer: Self-pay | Admitting: Orthopaedic Surgery

## 2018-05-10 NOTE — Telephone Encounter (Signed)
PT verbal orders for 1x wk/ 1 wk, then 3x/wk for 2wks. Please call to advise.

## 2018-05-10 NOTE — Telephone Encounter (Signed)
Per dr whitfield ok with PT orders

## 2018-05-20 ENCOUNTER — Ambulatory Visit (INDEPENDENT_AMBULATORY_CARE_PROVIDER_SITE_OTHER): Payer: Medicare Other | Admitting: Orthopaedic Surgery

## 2018-05-20 ENCOUNTER — Encounter (INDEPENDENT_AMBULATORY_CARE_PROVIDER_SITE_OTHER): Payer: Self-pay | Admitting: Orthopaedic Surgery

## 2018-05-20 VITALS — BP 108/68 | HR 75 | Ht 60.0 in | Wt 130.0 lb

## 2018-05-20 DIAGNOSIS — Z96641 Presence of right artificial hip joint: Secondary | ICD-10-CM

## 2018-05-20 NOTE — Progress Notes (Signed)
Office Visit Note   Patient: Karina [name]           Date of Birth: October 17, 1940           MRN: [id number] Visit Date: 05/20/2018              Requested by: [facility] Solian, MD [address] [place], [place] [address] PCP: [facility] Solian, MD   Assessment & Plan: Visit Diagnoses:  1. Status post right hip replacement     Plan: This post primary right total hip replacement and doing well.  Independent with a walker.  Taking a little pain medicine.  Steri-Strips reapplied after removing clips.  Wound healing nicely.  Continue with the 81 mg aspirin twice a day  Follow-Up Instructions: Return in about 2 weeks (around 06/03/2018).   Orders:  No orders of the defined types were placed in this encounter.  No orders of the defined types were placed in this encounter.     Procedures: No procedures performed   Clinical Data: No additional findings.   Subjective: Chief Complaint  Patient presents with  . Follow-up    05/07/18 R HIP, DOING WELL HAS SOME PAIN, WAKES PT UP FROM SLEEP AT TIMES  No related fever or chills.  Independent with walker.  Doing home exercises.  Home health therapy will continue through this week.  Use of pain meds.  Postoperative films in the recovery room after surgery demonstrated good position of the components  HPI  Review of Systems  Constitutional: Positive for fever. Negative for fatigue.  HENT: Negative for ear pain.   Eyes: Negative for pain.  Respiratory: Negative for cough and shortness of breath.   Cardiovascular: Positive for leg swelling.  Gastrointestinal: Negative for constipation and diarrhea.  Genitourinary: Negative for difficulty urinating.  Musculoskeletal: Negative for back pain and neck pain.  Skin: Negative for rash.  Allergic/Immunologic: Positive for food allergies.  Neurological: Positive for weakness. Negative for numbness.  Hematological: Bruises/bleeds easily.  Psychiatric/Behavioral: Positive for sleep disturbance.       Objective: Vital Signs: BP 108/68 (BP Location: Right Arm, Patient Position: Sitting, Cuff Size: Normal)   Pulse 75   Ht 5' (1.524 m)   Wt 130 lb (59 kg)   BMI 25.39 kg/m   Physical Exam  Ortho Exam right hip incision healing without problem.  Clips removed and Steri-Strips applied.  Minimal thigh swelling.  No distal edema.  Neurovascular exam intact.  Leg lengths appear to be symmetrical.  Was a little short preoperatively  Specialty Comments:  No specialty comments available.  Imaging: No results found.   PMFS History: Patient Active Problem List   Diagnosis Date Noted  . 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 Primary osteoarthritis of right hip 03/11/2018   Past Medical History:  Diagnosis Date  . Complication of anesthesia   . Diverticulosis   . Family history of adverse reaction to anesthesia    sister-N/V  . GERD (gastroesophageal reflux disease)   . Hiatal hernia   . HTN (hypertension)   . Hypercholesterolemia   . Hypothyroidism   . Melanoma ([place]) 1978   chemo  . Osteoporosis   . Panic attacks   . PONV (postoperative nausea and vomiting)     History reviewed. No pertinent family history.  Past Surgical History:  Procedure Laterality Date  . APPENDECTOMY    . BASAL CELL CARCINOMA EXCISION    . OVARIAN CYST REMOVAL     x 2  . SHOULDER SURGERY Bilateral  R-2006, L-2004  . THYROIDECTOMY, PARTIAL    . TOTAL ABDOMINAL HYSTERECTOMY W/ BILATERAL SALPINGOOPHORECTOMY    . TOTAL HIP ARTHROPLASTY Right 05/07/2018  . TOTAL HIP ARTHROPLASTY Right 05/07/2018   Procedure: RIGHT TOTAL HIP ARTHROPLASTY;  Surgeon: Garald Balding, MD;  Location: [facility];  Service: Orthopedics;  Laterality: Right;   Social History   Occupational History  . Not on file  Tobacco Use  . Smoking status: Current Some Day Smoker    Packs/day: 0.10    Years: 35.00    Pack years: 3.50    Types: Cigarettes  . Smokeless tobacco: Never Used  . Tobacco comment: 1 pack  weekly  Substance and Sexual Activity  . Alcohol use: No  . Drug use: No  . Sexual activity: Not on file

## 2018-06-07 ENCOUNTER — Ambulatory Visit (INDEPENDENT_AMBULATORY_CARE_PROVIDER_SITE_OTHER): Payer: Medicare Other | Admitting: Orthopaedic Surgery

## 2018-06-14 ENCOUNTER — Ambulatory Visit (INDEPENDENT_AMBULATORY_CARE_PROVIDER_SITE_OTHER): Payer: Medicare Other | Admitting: Orthopaedic Surgery

## 2018-06-14 ENCOUNTER — Encounter (INDEPENDENT_AMBULATORY_CARE_PROVIDER_SITE_OTHER): Payer: Self-pay | Admitting: Orthopaedic Surgery

## 2018-06-14 VITALS — BP 99/69 | HR 70 | Ht 60.0 in | Wt 130.0 lb

## 2018-06-14 DIAGNOSIS — Z96641 Presence of right artificial hip joint: Secondary | ICD-10-CM

## 2018-06-14 NOTE — Progress Notes (Signed)
Office Visit Note   Patient: Karina [name]           Date of Birth: July 29, 1940           MRN: [id number] Visit Date: 06/14/2018              Requested by: [facility] Solian, MD [address] [place], [place] [address] PCP: [facility] Solian, MD   Assessment & Plan: Visit Diagnoses:  1. Status post right hip replacement     Plan: 5 weeks status post primary right total hip replacement and doing quite well.  Leg lengths appear to be symmetrical.  No neurologic compromise.  Using a single-point cane.  Continue to work on her exercises and return in 3 months.  Follow-Up Instructions: Return in about 3 months (around 09/14/2018).   Orders:  No orders of the defined types were placed in this encounter.  No orders of the defined types were placed in this encounter.     Procedures: No procedures performed   Clinical Data: No additional findings.   Subjective: Chief Complaint  Patient presents with  . Right Hip - Routine Post Op, Follow-up    05/07/18 Right Total Hip Arthroplasty  Patient returns for follow up.  She is status post right total hip arthroplasty on 05/07/18. She is progressing well.  She is now ambulating with a cane and states that the pain is not that severe.  She continues Aspirin 81mg  twice daily. She also takes Tylenol mostly for the pain. Occasionally she may have to take something stronger.  No related shortness of breath or chest pain.  No fever or chills.  Very happy with her present course HPI  Review of Systems   Objective: Vital Signs: BP 99/69   Pulse 70   Ht 5' (1.524 m)   Wt 130 lb (59 kg)   BMI 25.39 kg/m   Physical Exam  Ortho Exam walks with minimal limp referable to her right hip.  Leg lengths appear to be symmetrical.  Neurologically intact.  No swelling.  Painless range of motion of right hip Specialty Comments:  No specialty comments available.  Imaging: No results found.   PMFS History: Patient Active Problem List   Diagnosis Date Noted  . 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 Primary osteoarthritis of right hip 03/11/2018   Past Medical History:  Diagnosis Date  . Complication of anesthesia   . Diverticulosis   . Family history of adverse reaction to anesthesia    sister-N/V  . GERD (gastroesophageal reflux disease)   . Hiatal hernia   . HTN (hypertension)   . Hypercholesterolemia   . Hypothyroidism   . Melanoma ([place]) 1978   chemo  . Osteoporosis   . Panic attacks   . PONV (postoperative nausea and vomiting)     History reviewed. No pertinent family history.  Past Surgical History:  Procedure Laterality Date  . APPENDECTOMY    . BASAL CELL CARCINOMA EXCISION    . OVARIAN CYST REMOVAL     x 2  . SHOULDER SURGERY Bilateral R-2006, L-2004  . THYROIDECTOMY, PARTIAL    . TOTAL ABDOMINAL HYSTERECTOMY W/ BILATERAL SALPINGOOPHORECTOMY    . TOTAL HIP ARTHROPLASTY Right 05/07/2018  . TOTAL HIP ARTHROPLASTY Right 05/07/2018   Procedure: RIGHT TOTAL HIP ARTHROPLASTY;  Surgeon: Garald Balding, MD;  Location: [facility];  Service: Orthopedics;  Laterality: Right;   Social History   Occupational History  . Not on file  Tobacco Use  . Smoking status:  Current Some Day Smoker    Packs/day: 0.10    Years: 35.00    Pack years: 3.50    Types: Cigarettes  . Smokeless tobacco: Never Used  . Tobacco comment: 1 pack weekly  Substance and Sexual Activity  . Alcohol use: No  . Drug use: No  . Sexual activity: Not on file

## 2018-07-24 HISTORY — PX: CATARACT EXTRACTION W/ INTRAOCULAR LENS IMPLANT: SHX1309

## 2018-09-25 ENCOUNTER — Ambulatory Visit (INDEPENDENT_AMBULATORY_CARE_PROVIDER_SITE_OTHER): Payer: Medicare Other | Admitting: Orthopaedic Surgery

## 2018-09-25 ENCOUNTER — Encounter (INDEPENDENT_AMBULATORY_CARE_PROVIDER_SITE_OTHER): Payer: Self-pay | Admitting: Orthopaedic Surgery

## 2018-09-25 VITALS — BP 96/77 | HR 66 | Ht 59.0 in | Wt 127.0 lb

## 2018-09-25 DIAGNOSIS — Z96641 Presence of right artificial hip joint: Secondary | ICD-10-CM | POA: Diagnosis not present

## 2018-09-25 NOTE — Progress Notes (Signed)
Office Visit Note   Patient: Karina [name]           Date of Birth: [birth year]           MRN: [id number] Visit Date: 09/25/2018              Requested by: [facility] Solian, MD [address] [place], [place] [address] PCP: [facility] Solian, MD   Assessment & Plan: Visit Diagnoses:  1. Status post right hip replacement     Plan: Approximately 4 months status post primary right total hip replacement doing well.  Does not have any the pain she had preoperatively.  Still needs a little bit of a "jump start" when she gets up from a sitting position but does not use any ambulatory aid.  Has been exercising.  Courage exercises and return in 6 months  Follow-Up Instructions: Return in about 6 months (around 03/28/2019).   Orders:  No orders of the defined types were placed in this encounter.  No orders of the defined types were placed in this encounter.     Procedures: No procedures performed   Clinical Data: No additional findings.   Subjective: Chief Complaint  Patient presents with  . Right Hip - Follow-up    Right THA DOS 05/07/18  Patient presents today for follow up on her right hip. Patient is now 5 months status post right total hip arthroplasty on 05/07/2018. She is much better, but still has pain in her groin. She is doing home exercises. She is taking Motrin as needed.  Pain is more less in the groin especially when she sits for long period of time really not having any mid thigh pain.  Not experiencing numbness or tingling  HPI  Review of Systems   Objective: Vital Signs: BP 96/77   Pulse 66   Ht 4\' 11"  (1.499 m)   Wt 127 lb (57.6 kg)   BMI 25.65 kg/m   Physical Exam  Ortho Exam leg lengths symmetrical.  Neurologically intact.  Able to actively flex extend internally externally rotate her right leg without any problem.  No pain over the incision or over the greater trochanter Right hip Specialty Comments:  No specialty comments available.  Imaging: No  results found.   PMFS History: Patient Active Problem List   Diagnosis Date Noted  . 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 Primary osteoarthritis of right hip 03/11/2018   Past Medical History:  Diagnosis Date  . Complication of anesthesia   . Diverticulosis   . Family history of adverse reaction to anesthesia    sister-N/V  . GERD (gastroesophageal reflux disease)   . Hiatal hernia   . HTN (hypertension)   . Hypercholesterolemia   . Hypothyroidism   . Melanoma ([facility] [place]) 1978   chemo  . Osteoporosis   . Panic attacks   . PONV (postoperative nausea and vomiting)     History reviewed. No pertinent family history.  Past Surgical History:  Procedure Laterality Date  . APPENDECTOMY    . BASAL CELL CARCINOMA EXCISION    . OVARIAN CYST REMOVAL     x 2  . SHOULDER SURGERY Bilateral R-2006, L-2004  . THYROIDECTOMY, PARTIAL    . TOTAL ABDOMINAL HYSTERECTOMY W/ BILATERAL SALPINGOOPHORECTOMY    . TOTAL HIP ARTHROPLASTY Right 05/07/2018  . TOTAL HIP ARTHROPLASTY Right 05/07/2018   Procedure: RIGHT TOTAL HIP ARTHROPLASTY;  Surgeon: Garald Balding, MD;  Location: [facility];  Service: Orthopedics;  Laterality: Right;   Social History  Occupational History  . Not on file  Tobacco Use  . Smoking status: Former Smoker    Years: 35.00    Types: Cigarettes  . Smokeless tobacco: Never Used  Substance and Sexual Activity  . Alcohol use: No  . Drug use: No  . Sexual activity: Not on file

## 2018-10-03 ENCOUNTER — Ambulatory Visit
Admission: EM | Admit: 2018-10-03 | Discharge: 2018-10-03 | Disposition: A | Discharge status: Home / Self Care | Payer: Medicare Other | Attending: Family Medicine | Admitting: Family Medicine

## 2018-10-03 DIAGNOSIS — R110 Nausea: Secondary | ICD-10-CM | POA: Diagnosis not present

## 2018-10-03 NOTE — Discharge Instructions (Signed)
Tylenol and Ibuprofen as needed for any body aches  Continue to monitor for resolution of symptoms  Follow up if developing new symptoms, headache, vision changes, shortness of breath, difficulty breathing
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
## 2019-02-26 ENCOUNTER — Ambulatory Visit (INDEPENDENT_AMBULATORY_CARE_PROVIDER_SITE_OTHER): Payer: Medicare Other | Admitting: Orthopaedic Surgery

## 2019-02-26 ENCOUNTER — Ambulatory Visit: Payer: Self-pay

## 2019-02-26 ENCOUNTER — Encounter: Payer: Self-pay | Admitting: Orthopaedic Surgery

## 2019-02-26 VITALS — BP 133/90 | HR 66 | Ht 59.0 in | Wt 129.0 lb

## 2019-02-26 DIAGNOSIS — G8929 Other chronic pain: Secondary | ICD-10-CM

## 2019-02-26 DIAGNOSIS — M25561 Pain in right knee: Secondary | ICD-10-CM

## 2019-02-26 MED ORDER — BUPIVACAINE HCL 0.5 % IJ SOLN
2.0000 mL | INTRAMUSCULAR | Status: AC | PRN
  Administered 2019-02-26: 2 mL via INTRA_ARTICULAR

## 2019-02-26 MED ORDER — LIDOCAINE HCL 1 % IJ SOLN
2.0000 mL | INTRAMUSCULAR | Status: AC | PRN
  Administered 2019-02-26: 2 mL

## 2019-02-26 MED ORDER — METHYLPREDNISOLONE ACETATE 40 MG/ML IJ SUSP
80.0000 mg | INTRAMUSCULAR | Status: AC | PRN
  Administered 2019-02-26: 80 mg via INTRA_ARTICULAR

## 2019-02-26 NOTE — Progress Notes (Signed)
Office Visit Note   Patient: Karina [name]           Date of Birth: 1941/07/12           MRN: [id number] Visit Date: 02/26/2019              Requested by: Prince Solian, MD [address] [place],  [place] [address] PCP: Prince Solian, MD   Assessment & Plan: Visit Diagnoses:  1. Chronic pain of right knee     Plan: Pain right knee.  Will inject with cortisone.  If no response will evaluate right hip replacement and back.  Follow-Up Instructions: Return if symptoms worsen or fail to improve.   Orders:  Orders Placed This Encounter  Procedures  . Large Joint Inj: R knee  . XR KNEE 3 VIEW RIGHT   No orders of the defined types were placed in this encounter.     Procedures: Large Joint Inj: R knee on 02/26/2019 2:07 PM Indications: pain and diagnostic evaluation Details: 25 G 1.5 in needle, anteromedial approach  Arthrogram: No  Medications: 2 mL lidocaine 1 %; 2 mL bupivacaine 0.5 %; 80 mg methylPREDNISolone acetate 40 MG/ML Procedure, treatment alternatives, risks and benefits explained, specific risks discussed. Consent was given by the patient. Immediately prior to procedure a time out was called to verify the correct patient, procedure, equipment, support staff and site/side marked as required. Patient was prepped and draped in the usual sterile fashion.       Clinical Data: No additional findings.   Subjective: Chief Complaint  Patient presents with  . Right Knee - Pain  Patient presents today for right knee pain. The pain has [name] present for a long time, but has gotten worse in the last few months. She said that it swells, hurts, and feels tired. She takes tylenol or celebrex as needed. Karina [name] has a history of right total hip replacement relates that she is doing extremely well without any problems.  She is also had an issue with the lumbar spine has had prior epidural steroid injections.  She thinks her problem is localized to her right knee.  No  injury or trauma.  Occasionally she has a little swelling.  Unable to localize her pain  HPI  Review of Systems   Objective: Vital Signs: BP 133/90   Pulse 66   Ht 4\' 11"  (1.499 m)   Wt 129 lb (58.5 kg)   BMI 26.05 kg/m   Physical Exam Constitutional:      Appearance: She is well-developed.  Eyes:     Pupils: Pupils are equal, round, and reactive to light.  Pulmonary:     Effort: Pulmonary effort is normal.  Skin:    General: Skin is warm and dry.  Neurological:     Mental Status: She is alert and oriented to person, place, and time.  Psychiatric:        Behavior: Behavior normal.     Ortho Exam awake alert and oriented x3.  Comfortable sitting.  Little bit of a limp in her right leg that seems to be referable to her right knee.  Small effusion right knee.  Some mild medial joint pain.  None laterally.  No patellar crepitation.  Painless range of motion of her right hip replacement.  Straight leg raise negative.  No percussible tenderness of lumbar spine  Specialty Comments:  No specialty comments available.  Imaging: Xr Knee 3 View Right  Result Date: 02/26/2019 Films of the right knee were  obtained in several projections.  There is some decreased bone density.  No acute changes about the knee.  Joint spaces appear to be well-maintained.  Some subchondral sclerosis in the medial femoral condyle but no peripheral osteophytes or subchondral cysts.  Pain could be associated with mild arthritis are referred from elsewhere    PMFS History: Patient Active Problem List   Diagnosis Date Noted  . 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 Primary osteoarthritis of right hip 03/11/2018   Past Medical History:  Diagnosis Date  . Complication of anesthesia   . Diverticulosis   . Family history of adverse reaction to anesthesia    sister-N/V  . GERD (gastroesophageal reflux disease)   . Hiatal hernia   . HTN (hypertension)   . Hypercholesterolemia   . Hypothyroidism    . Melanoma ([place]) 1978   chemo  . Osteoporosis   . Panic attacks   . PONV (postoperative nausea and vomiting)     History reviewed. No pertinent family history.  Past Surgical History:  Procedure Laterality Date  . APPENDECTOMY    . BASAL CELL CARCINOMA EXCISION    . OVARIAN CYST REMOVAL     x 2  . SHOULDER SURGERY Bilateral R-2006, L-2004  . THYROIDECTOMY, PARTIAL    . TOTAL ABDOMINAL HYSTERECTOMY W/ BILATERAL SALPINGOOPHORECTOMY    . TOTAL HIP ARTHROPLASTY Right 05/07/2018  . TOTAL HIP ARTHROPLASTY Right 05/07/2018   Procedure: RIGHT TOTAL HIP ARTHROPLASTY;  Surgeon: Garald Balding, MD;  Location: [facility];  Service: Orthopedics;  Laterality: Right;   Social History   Occupational History  . Not on file  Tobacco Use  . Smoking status: Former Smoker    Years: 35.00    Types: Cigarettes  . Smokeless tobacco: Never Used  Substance and Sexual Activity  . Alcohol use: No  . Drug use: No  . Sexual activity: Not on file

## 2019-03-28 ENCOUNTER — Ambulatory Visit: Payer: Self-pay | Admitting: Orthopaedic Surgery

## 2019-04-01 ENCOUNTER — Ambulatory Visit: Payer: Medicare Other | Admitting: Orthopaedic Surgery

## 2019-04-17 ENCOUNTER — Encounter: Payer: Self-pay | Admitting: Orthopaedic Surgery

## 2019-04-17 ENCOUNTER — Other Ambulatory Visit: Payer: Self-pay

## 2019-04-17 ENCOUNTER — Ambulatory Visit (INDEPENDENT_AMBULATORY_CARE_PROVIDER_SITE_OTHER): Payer: Medicare Other | Admitting: Orthopaedic Surgery

## 2019-04-17 VITALS — BP 140/99 | HR 60 | Ht 60.0 in | Wt 129.0 lb

## 2019-04-17 DIAGNOSIS — Z96641 Presence of right artificial hip joint: Secondary | ICD-10-CM | POA: Diagnosis not present

## 2019-04-17 DIAGNOSIS — M1611 Unilateral primary osteoarthritis, right hip: Secondary | ICD-10-CM | POA: Diagnosis not present

## 2019-04-17 NOTE — Progress Notes (Signed)
Office Visit Note   Patient: Karina [name]           Date of Birth: May 24, 1941           MRN: [id number] Visit Date: 04/17/2019              Requested by: Prince Solian, MD [address] [place],  [place] [address] PCP: Prince Solian, MD   Assessment & Plan: Visit Diagnoses:  1. Primary osteoarthritis of right hip   2. Status post right hip replacement     Plan: Nearly 1 year status post right total hip replacement and doing quite well.  No related issues.  I recently saw Karina [name] for a problem with her right knee with a cortisone injection she notes that that made a big difference as well.  She obviously has 2 separate problems with some mild arthritis in her right knee.  I have encouraged her to work on exercises for strengthening and to be sure she takes antibiotics for any invasive procedure.  We will plan to see her back as needed  Follow-Up Instructions: Return if symptoms worsen or fail to improve.   Orders:  No orders of the defined types were placed in this encounter.  No orders of the defined types were placed in this encounter.     Procedures: No procedures performed   Clinical Data: No additional findings.   Subjective: Chief Complaint  Patient presents with  . Right Hip - Follow-up    Right THA DOS 05/07/18  Patient presents today for follow up on her right hip. She had a right total hip arthroplasty on 05/07/18. She is now 11 months out from surgery. She states that she is doing "pretty good". She said that it feels more like normal now, and is able to get around better. She states that her balance has improved. She takes tylenol as needed.   HPI  Review of Systems   Objective: Vital Signs: BP (!) 140/99   Pulse 60   Ht 5' (1.524 m)   Wt 129 lb (58.5 kg)   BMI 25.19 kg/m   Physical Exam Constitutional:      Appearance: She is well-developed.  Eyes:     Pupils: Pupils are equal, round, and reactive to light.  Pulmonary:     Effort:  Pulmonary effort is normal.  Skin:    General: Skin is warm and dry.  Neurological:     Mental Status: She is alert and oriented to person, place, and time.  Psychiatric:        Behavior: Behavior normal.     Ortho Exam awake alert and oriented x3.  Comfortable sitting.  Had a very minimal effusion of her right knee but no significant pain.  Painless range of motion of right hip with internal and external rotation flexion and extension.  No areas of tenderness about the right hip.  Leg lengths appear to be symmetrical.  Motor exam intact.  Straight leg raise negative.  No percussible tenderness of the lumbar spine  Specialty Comments:  No specialty comments available.  Imaging: No results found.   PMFS History: Patient Active Problem List   Diagnosis Date Noted  . 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 Primary osteoarthritis of right hip 03/11/2018   Past Medical History:  Diagnosis Date  . Complication of anesthesia   . Diverticulosis   . Family history of adverse reaction to anesthesia    sister-N/V  . GERD (gastroesophageal reflux disease)   .  Hiatal hernia   . HTN (hypertension)   . Hypercholesterolemia   . Hypothyroidism   . Melanoma ([place]) 1978   chemo  . Osteoporosis   . Panic attacks   . PONV (postoperative nausea and vomiting)     History reviewed. No pertinent family history.  Past Surgical History:  Procedure Laterality Date  . APPENDECTOMY    . BASAL CELL CARCINOMA EXCISION    . OVARIAN CYST REMOVAL     x 2  . SHOULDER SURGERY Bilateral R-2006, L-2004  . THYROIDECTOMY, PARTIAL    . TOTAL ABDOMINAL HYSTERECTOMY W/ BILATERAL SALPINGOOPHORECTOMY    . TOTAL HIP ARTHROPLASTY Right 05/07/2018  . TOTAL HIP ARTHROPLASTY Right 05/07/2018   Procedure: RIGHT TOTAL HIP ARTHROPLASTY;  Surgeon: Garald Balding, MD;  Location: [facility];  Service: Orthopedics;  Laterality: Right;   Social History   Occupational History  . Not on file  Tobacco Use  .  Smoking status: Former Smoker    Years: 35.00    Types: Cigarettes  . Smokeless tobacco: Never Used  Substance and Sexual Activity  . Alcohol use: No  . Drug use: No  . Sexual activity: Not on file

## 2019-09-07 ENCOUNTER — Ambulatory Visit: Payer: Medicare Other | Attending: Internal Medicine

## 2019-09-07 DIAGNOSIS — Z23 Encounter for immunization: Secondary | ICD-10-CM

## 2019-09-07 NOTE — Progress Notes (Signed)
   Covid-19 Vaccination Clinic  Name:  Karina [name]    MRN: [name] DOB: [birth year]  09/07/2019  [name] was observed post Covid-19 immunization for 15 minutes without incidence. She was provided with Vaccine Information Sheet and instruction to access the V-Safe system.   [name] was instructed to call 911 with any severe reactions post vaccine: Marland Kitchen Difficulty breathing  . Swelling of your face and throat  . A fast heartbeat  . A bad rash all over your body  . Dizziness and weakness    Immunizations Administered    Name Date Dose VIS Date Route   Pfizer COVID-19 Vaccine 09/07/2019 10:01 AM 0.3 mL 07/04/2019 Intramuscular   Manufacturer: [place]   Lot: [id number]   [place]: [id number]

## 2019-10-01 ENCOUNTER — Ambulatory Visit: Payer: Medicare Other | Attending: Internal Medicine

## 2019-10-01 DIAGNOSIS — Z23 Encounter for immunization: Secondary | ICD-10-CM

## 2019-10-01 NOTE — Progress Notes (Signed)
   Covid-19 Vaccination Clinic  Name:  Minakshi Nordine    MRN: [id number] DOB: [birth year]  10/01/2019  Ms. Neuhauser was observed post Covid-19 immunization for 15 minutes without incident. She was provided with Vaccine Information Sheet and instruction to access the V-Safe system.   Ms. Vondrak was instructed to call 911 with any severe reactions post vaccine: Marland Kitchen Difficulty breathing  . Swelling of face and throat  . A fast heartbeat  . A bad rash all over body  . Dizziness and weakness   Immunizations Administered    Name Date Dose VIS Date Route   Pfizer COVID-19 Vaccine 10/01/2019  3:41 PM 0.3 mL 07/04/2019 Intramuscular   Manufacturer: [place]   Lot: [id number]   [place]: [id number]

## 2020-01-20 ENCOUNTER — Ambulatory Visit: Payer: Medicare Other | Admitting: Orthopaedic Surgery

## 2020-01-28 ENCOUNTER — Ambulatory Visit (INDEPENDENT_AMBULATORY_CARE_PROVIDER_SITE_OTHER): Payer: Medicare Other

## 2020-01-28 ENCOUNTER — Other Ambulatory Visit: Payer: Self-pay

## 2020-01-28 ENCOUNTER — Encounter: Payer: Self-pay | Admitting: Orthopaedic Surgery

## 2020-01-28 ENCOUNTER — Ambulatory Visit: Payer: Medicare Other | Admitting: Orthopaedic Surgery

## 2020-01-28 VITALS — Ht 59.5 in | Wt 129.0 lb

## 2020-01-28 DIAGNOSIS — G8929 Other chronic pain: Secondary | ICD-10-CM | POA: Diagnosis not present

## 2020-01-28 DIAGNOSIS — M5442 Lumbago with sciatica, left side: Secondary | ICD-10-CM | POA: Diagnosis not present

## 2020-01-28 DIAGNOSIS — M5441 Lumbago with sciatica, right side: Secondary | ICD-10-CM | POA: Diagnosis not present

## 2020-01-28 DIAGNOSIS — M25552 Pain in left hip: Secondary | ICD-10-CM

## 2020-01-28 DIAGNOSIS — M545 Low back pain, unspecified: Secondary | ICD-10-CM | POA: Insufficient documentation

## 2020-01-28 NOTE — Progress Notes (Signed)
Office Visit Note   Patient: Karina [name]           Date of Birth: 03/19/1941           MRN: [id number] Visit Date: 01/28/2020              Requested by: Prince Solian, MD [address] [place],  [place] [address] PCP: Prince Solian, MD   Assessment & Plan: Visit Diagnoses:  1. Pain in left hip   2. 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  I would not be surprised if she has stenosis based on her symptoms of claudication.  She has had problems about 3 months despite taking Celebrex and really has not made much of a difference.  I think it is worth obtaining an MRI scan as she may be a candidate for epidural steroid injections.  No specific problems referable to her right hip replacement  Follow-Up Instructions: Return After MRI scan lumbar spine.   Orders:  Orders Placed This Encounter  Procedures  . XR Lumbar Spine 2-3 Views  . XR Pelvis 1-2 Views  . MR Lumbar Spine w/o contrast   No orders of the defined types were placed in this encounter.     Procedures: No procedures performed   Clinical Data: No additional findings.   Subjective: Chief Complaint  Patient presents with  . Left Hip - Pain  Patient presents today for left hip pain. No known injury. She said that it has been hurting for 2-3 months. The pain occurs when she gets out and starts walking. Her pain is located lateral hip, groin, buttock, and travels down her leg sometimes. She states that both legs feels weak. She has some tingling in her left foot. She is taking Celebrex as needed.  Has been having trouble as mentioned for several months.  She has been taking Celebrex with questionable relief.  She has pain that begins in her low back and will radiate more to the left than the right side.  She has had pain referred to the iliac crest left buttock and "cheeks".  On  occasion she has had difficulty walking with pain referred to her left knee.  No bowel or bladder changes.  No history of injury or trauma  HPI  Review of Systems   Objective: Vital Signs: Ht 4' 11.5" (1.511 m)   Wt 129 lb (58.5 kg)   BMI 25.62 kg/m   Physical Exam Constitutional:      Appearance: She is well-developed.  Eyes:     Pupils: Pupils are equal, round, and reactive to light.  Pulmonary:     Effort: Pulmonary effort is normal.  Skin:    General: Skin is warm and dry.  Neurological:     Mental Status: She is alert and oriented to person, place, and time.  Psychiatric:        Behavior: Behavior normal.     Ortho Exam alert and oriented x3 comfortable sitting.  Straight leg raise was negative.  Painless range of motion of both hips.  Motor exam intact distally.  No knee pain.  Very minimal percussible tenderness of lumbar spine.  No pain over the sacroiliac joints.  No discomfort over either iliac crest or lateral aspect of either hip  Specialty Comments:  No specialty comments available.  Imaging: XR Lumbar Spine 2-3 Views  Result Date:  01/28/2020 Films of the lumbar spine obtained in 2 projections.  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  No listhesis.  Very minimal left degenerative scoliosis.  No acute changes  XR Pelvis 1-2 Views  Result Date: 01/28/2020 AP pelvis demonstrates an intact right total hip replacement without obvious complication.  There does not appear to be any obvious wear pattern.  Left hip appears to be relatively benign without narrowing of the joint space or subchondral sclerosis.  SI joints intact.  No acute changes    PMFS History: Patient Active Problem List   Diagnosis Date Noted  . Low back pain 01/28/2020  . 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 Primary osteoarthritis of right hip 03/11/2018   Past Medical History:  Diagnosis Date  .  Complication of anesthesia   . Diverticulosis   . Family history of adverse reaction to anesthesia    sister-N/V  . GERD (gastroesophageal reflux disease)   . Hiatal hernia   . HTN (hypertension)   . Hypercholesterolemia   . Hypothyroidism   . Melanoma ([place]) 1978   chemo  . Osteoporosis   . Panic attacks   . PONV (postoperative nausea and vomiting)     History reviewed. No pertinent family history.  Past Surgical History:  Procedure Laterality Date  . APPENDECTOMY    . BASAL CELL CARCINOMA EXCISION    . OVARIAN CYST REMOVAL     x 2  . SHOULDER SURGERY Bilateral R-2006, L-2004  . THYROIDECTOMY, PARTIAL    . TOTAL ABDOMINAL HYSTERECTOMY W/ BILATERAL SALPINGOOPHORECTOMY    . TOTAL HIP ARTHROPLASTY Right 05/07/2018  . TOTAL HIP ARTHROPLASTY Right 05/07/2018   Procedure: RIGHT TOTAL HIP ARTHROPLASTY;  Surgeon: Garald Balding, MD;  Location: [facility];  Service: Orthopedics;  Laterality: Right;   Social History   Occupational History  . Not on file  Tobacco Use  . Smoking status: Former Smoker    Years: 35.00    Types: Cigarettes  . Smokeless tobacco: Never Used  Vaping Use  . Vaping Use: Never used  Substance and Sexual Activity  . Alcohol use: No  . Drug use: No  . Sexual activity: Not on file

## 2020-02-16 ENCOUNTER — Ambulatory Visit
Admission: RE | Admit: 2020-02-16 | Discharge: 2020-02-16 | Disposition: A | Discharge status: Home / Self Care | Payer: Medicare Other | Source: Ambulatory Visit | Attending: Orthopaedic Surgery | Admitting: Orthopaedic Surgery

## 2020-02-16 DIAGNOSIS — M25552 Pain in left hip: Secondary | ICD-10-CM

## 2020-03-01 ENCOUNTER — Other Ambulatory Visit: Payer: Medicare Other

## 2020-03-02 ENCOUNTER — Other Ambulatory Visit: Payer: Medicare Other

## 2020-03-18 ENCOUNTER — Telehealth: Payer: Self-pay | Admitting: Orthopaedic Surgery

## 2020-03-30 ENCOUNTER — Other Ambulatory Visit: Payer: Self-pay

## 2020-03-30 ENCOUNTER — Telehealth: Payer: Self-pay | Admitting: Orthopaedic Surgery

## 2020-03-30 ENCOUNTER — Ambulatory Visit: Payer: Medicare Other | Admitting: Orthopaedic Surgery

## 2020-03-30 DIAGNOSIS — G8929 Other chronic pain: Secondary | ICD-10-CM

## 2020-03-30 NOTE — Telephone Encounter (Signed)
Pt called stating she missed a call from Dr.Whitfield but would like for him to try her again.  [phone]

## 2020-03-30 NOTE — Telephone Encounter (Signed)
E 

## 2020-04-06 ENCOUNTER — Telehealth: Payer: Self-pay

## 2020-04-06 NOTE — Telephone Encounter (Signed)
Patient called she wants an appointment for an injection in her back with Dr.Newton call back:[phone].

## 2020-04-07 NOTE — Telephone Encounter (Signed)
Scheduled

## 2020-04-07 NOTE — Telephone Encounter (Signed)
Documented in referral  

## 2020-04-07 NOTE — Telephone Encounter (Signed)
Pt called stating she had a Dr. appt this morning but will be in the house this afternoon if Loma Sousa wanted to try her again.  [phone]

## 2020-04-19 ENCOUNTER — Ambulatory Visit: Payer: Medicare Other | Admitting: Physical Medicine and Rehabilitation

## 2020-04-19 ENCOUNTER — Other Ambulatory Visit: Payer: Self-pay

## 2020-04-19 ENCOUNTER — Ambulatory Visit: Payer: Self-pay

## 2020-04-19 ENCOUNTER — Encounter: Payer: Self-pay | Admitting: Physical Medicine and Rehabilitation

## 2020-04-19 VITALS — BP 111/76 | HR 63

## 2020-04-19 DIAGNOSIS — M5416 Radiculopathy, lumbar region: Secondary | ICD-10-CM | POA: Diagnosis not present

## 2020-04-19 MED ORDER — BETAMETHASONE SOD PHOS & ACET 6 (3-3) MG/ML IJ SUSP
12.0000 mg | Freq: Once | INTRAMUSCULAR | Status: AC
  Administered 2020-04-19: 12 mg

## 2020-04-19 NOTE — Procedures (Signed)
Lumbar Epidural Steroid Injection - Interlaminar Approach with Fluoroscopic Guidance  Patient: Karina [name]      Date of Birth: Jun 20, 1941 MRN: [id number] PCP: Prince Solian, MD      Visit Date: 04/19/2020   Universal Protocol:     Consent Given By: the patient  Position: PRONE  Additional Comments: Vital signs were monitored before and after the procedure. Patient was prepped and draped in the usual sterile fashion. The correct patient, procedure, and site was verified.   Injection Procedure Details:  Procedure Site One Meds Administered:  Meds ordered this encounter  Medications  . betamethasone acetate-betamethasone sodium phosphate (CELESTONE) injection 12 mg     Laterality: Left  Location/Site:  L4-L5  Needle size: 20 G  Needle type: Tuohy  Needle Placement: Paramedian epidural  Findings:   -Comments: Excellent flow of contrast into the epidural space.  Procedure Details: Using a paramedian approach from the side mentioned above, the region overlying the inferior lamina was localized under fluoroscopic visualization and the soft tissues overlying this structure were infiltrated with 4 ml. of 1% Lidocaine without Epinephrine. The Tuohy needle was inserted into the epidural space using a paramedian approach.   The epidural space was localized using loss of resistance along with lateral and bi-planar fluoroscopic views.  After negative aspirate for air, blood, and CSF, a 2 ml. volume of Isovue-250 was injected into the epidural space and the flow of contrast was observed. Radiographs were obtained for documentation purposes.    The injectate was administered into the level noted above.   Additional Comments:  The patient tolerated the procedure well Dressing: 2 x 2 sterile gauze and Band-Aid    Post-procedure details: Patient was observed during the procedure. Post-procedure instructions were reviewed.  Patient left the clinic in stable condition.

## 2020-04-19 NOTE — Progress Notes (Signed)
Karina [name] - 79 y.o. female MRN [id number]  Date of birth: 24-Sep-1940  Office Visit Note: Visit Date: 04/19/2020 PCP: Prince Solian, MD Referred by: Prince Solian, MD  Subjective: Chief Complaint  Patient presents with  . Lower Back - Pain, Weakness  . Left Leg - Weakness, Pain  . Right Leg - Weakness, Pain   HPI:  Karina [name] is a 79 y.o. female who comes in today at the request of Dr. [name] for planned Left L4-L5 Lumbar epidural steroid injection with fluoroscopic guidance.  The patient has failed conservative care including home exercise, medications, time and activity modification.  This injection will be diagnostic and hopefully therapeutic.  Please see requesting physician notes for further details and justification.  MRI reviewed with images and spine model.  MRI reviewed in the note below.   ROS Otherwise per HPI.  Assessment & Plan: Visit Diagnoses:  1. Lumbar radiculopathy     Plan: No additional findings.   Meds & Orders:  Meds ordered this encounter  Medications  . betamethasone acetate-betamethasone sodium phosphate (CELESTONE) injection 12 mg    Orders Placed This Encounter  Procedures  . XR C-ARM NO REPORT  . Epidural Steroid injection    Follow-up: Return if symptoms worsen or fail to improve.   Procedures: No procedures performed  Lumbar Epidural Steroid Injection - Interlaminar Approach with Fluoroscopic Guidance  Patient: Karina [name]      Date of Birth: 1941/01/10 MRN: [id number] PCP: Prince Solian, MD      Visit Date: 04/19/2020   Universal Protocol:     Consent Given By: the patient  Position: PRONE  Additional Comments: Vital signs were monitored before and after the procedure. Patient was prepped and draped in the usual sterile fashion. The correct patient, procedure, and site was verified.   Injection Procedure Details:  Procedure Site One Meds Administered:  Meds ordered this encounter  Medications  .  betamethasone acetate-betamethasone sodium phosphate (CELESTONE) injection 12 mg     Laterality: Left  Location/Site:  L4-L5  Needle size: 20 G  Needle type: Tuohy  Needle Placement: Paramedian epidural  Findings:   -Comments: Excellent flow of contrast into the epidural space.  Procedure Details: Using a paramedian approach from the side mentioned above, the region overlying the inferior lamina was localized under fluoroscopic visualization and the soft tissues overlying this structure were infiltrated with 4 ml. of 1% Lidocaine without Epinephrine. The Tuohy needle was inserted into the epidural space using a paramedian approach.   The epidural space was localized using loss of resistance along with lateral and bi-planar fluoroscopic views.  After negative aspirate for air, blood, and CSF, a 2 ml. volume of Isovue-250 was injected into the epidural space and the flow of contrast was observed. Radiographs were obtained for documentation purposes.    The injectate was administered into the level noted above.   Additional Comments:  The patient tolerated the procedure well Dressing: 2 x 2 sterile gauze and Band-Aid    Post-procedure details: Patient was observed during the procedure. Post-procedure instructions were reviewed.  Patient left the clinic in stable condition.     Clinical History: Low back pain with left hip pain  EXAM: MRI LUMBAR SPINE WITHOUT CONTRAST  TECHNIQUE: Multiplanar, multisequence MR imaging of the lumbar spine was performed. No intravenous contrast was administered.  COMPARISON:  MRI lumbar spine 07/04/2008  FINDINGS: Segmentation:  Normal  Alignment:  Normal  Vertebrae:  Normal bone marrow.  Negative for fracture or mass.  Conus medullaris and cauda equina: Conus extends to the L1-2 level. Conus and cauda equina appear normal.  Paraspinal and other soft tissues: Negative for paraspinous mass or adenopathy. No soft tissue  fluid collection identified.  Disc levels:  T12-L1: Disc degeneration with Schmorl's node. Shallow left-sided disc protrusion has developed since the prior MRI. Negative for stenosis.  L1-2: Moderate to advanced disc degeneration with diffuse endplate spurring. No significant spinal or foraminal stenosis.  L2-3: Moderate to advanced disc degeneration with disc space narrowing and diffuse endplate spurring. Mild subarticular stenosis bilaterally. No interval change from the prior study.  L3-4: Advanced disc degeneration with disc space narrowing and diffuse endplate spurring. Mild spinal stenosis. Mild to moderate subarticular stenosis bilaterally. Mild progression of stenosis since the prior study  L4-5: Disc degeneration with diffuse disc bulging. Diffuse endplate spurring left greater than right. Moderate spinal stenosis and moderate subarticular stenosis on the left. Mild progression from the prior study. Mild subarticular stenosis on the right  L5-S1: Advanced disc degeneration with diffuse endplate spurring. Moderate facet hypertrophy bilaterally. Moderate subarticular and foraminal stenosis on the left with mild progression. Mild subarticular stenosis on the right.  IMPRESSION: Multilevel disc degeneration and spurring throughout the lumbar spine as above.  Progression of mild spinal stenosis and mild to moderate subarticular stenosis bilaterally at L3-4.  Moderate spinal stenosis and moderate subarticular stenosis on the left at L4-5 due to spurring has progressed in the interval.  Disc degeneration and spurring on the left at L5-S1. Moderate subarticular and foraminal stenosis with mild progression.   Electronically Signed   By: Franchot Gallo M.D.   On: 02/16/2020 10:14     Objective:  VS:  HT:    WT:   BMI:     BP:111/76  HR:63bpm  TEMP: ( )  RESP:  Physical Exam Constitutional:      General: She is not in acute distress.     Appearance: Normal appearance. She is not ill-appearing.  HENT:     Head: Normocephalic and atraumatic.     Right Ear: External ear normal.     Left Ear: External ear normal.  Eyes:     Extraocular Movements: Extraocular movements intact.  Cardiovascular:     Rate and Rhythm: Normal rate.     Pulses: Normal pulses.  Musculoskeletal:     Right lower leg: No edema.     Left lower leg: No edema.     Comments: Patient has good distal strength with no pain over the greater trochanters.  No clonus or focal weakness.  Skin:    Findings: No erythema, lesion or rash.  Neurological:     General: No focal deficit present.     Mental Status: She is alert and oriented to person, place, and time.     Sensory: No sensory deficit.     Motor: No weakness or abnormal muscle tone.     Coordination: Coordination normal.  Psychiatric:        Mood and Affect: Mood normal.        Behavior: Behavior normal.      Imaging: XR C-ARM NO REPORT  Result Date: 04/19/2020 Please see Notes tab for imaging impression.

## 2020-04-19 NOTE — Progress Notes (Signed)
Pt states lower back pain that travels down both positier legs. Pt state walking makes the pain worse. Pt state she take pain meds and icy pack to ease her pain.  Numeric Pain Rating Scale and Functional Assessment Average Pain 7   In the last MONTH (on 0-10 scale) has pain interfered with the following?  1. General activity like being  able to carry out your everyday physical activities such as walking, climbing stairs, carrying groceries, or moving a chair?  Rating(9)   +Driver, -BT, -Dye Allergies.

## 2020-04-20 ENCOUNTER — Ambulatory Visit: Payer: Medicare Other | Admitting: Orthopaedic Surgery

## 2020-05-04 ENCOUNTER — Ambulatory Visit: Payer: Medicare Other | Admitting: Orthopaedic Surgery

## 2020-07-23 ENCOUNTER — Emergency Department (HOSPITAL_COMMUNITY): Payer: Medicare Other

## 2020-07-23 ENCOUNTER — Emergency Department (HOSPITAL_COMMUNITY)
Admission: EM | Admit: 2020-07-23 | Discharge: 2020-07-23 | Disposition: A | Discharge status: Home / Self Care | Payer: Medicare Other | Attending: Emergency Medicine | Admitting: Emergency Medicine

## 2020-07-23 ENCOUNTER — Encounter (HOSPITAL_COMMUNITY): Payer: Self-pay

## 2020-07-23 DIAGNOSIS — E039 Hypothyroidism, unspecified: Secondary | ICD-10-CM | POA: Diagnosis not present

## 2020-07-23 DIAGNOSIS — Z7982 Long term (current) use of aspirin: Secondary | ICD-10-CM | POA: Insufficient documentation

## 2020-07-23 DIAGNOSIS — F10129 Alcohol abuse with intoxication, unspecified: Secondary | ICD-10-CM | POA: Diagnosis not present

## 2020-07-23 DIAGNOSIS — R4182 Altered mental status, unspecified: Secondary | ICD-10-CM | POA: Diagnosis present

## 2020-07-23 DIAGNOSIS — Z20822 Contact with and (suspected) exposure to covid-19: Secondary | ICD-10-CM | POA: Insufficient documentation

## 2020-07-23 DIAGNOSIS — Z79899 Other long term (current) drug therapy: Secondary | ICD-10-CM | POA: Insufficient documentation

## 2020-07-23 DIAGNOSIS — Z87891 Personal history of nicotine dependence: Secondary | ICD-10-CM | POA: Diagnosis not present

## 2020-07-23 DIAGNOSIS — Z96611 Presence of right artificial shoulder joint: Secondary | ICD-10-CM | POA: Insufficient documentation

## 2020-07-23 DIAGNOSIS — F10920 Alcohol use, unspecified with intoxication, uncomplicated: Secondary | ICD-10-CM

## 2020-07-23 DIAGNOSIS — I10 Essential (primary) hypertension: Secondary | ICD-10-CM | POA: Diagnosis not present

## 2020-07-23 LAB — COMPREHENSIVE METABOLIC PANEL
ALT: 26 U/L (ref 0–44)
AST: 25 U/L (ref 15–41)
Albumin: 3.6 g/dL (ref 3.5–5.0)
Alkaline Phosphatase: 51 U/L (ref 38–126)
Anion gap: 11 (ref 5–15)
BUN: 16 mg/dL (ref 8–23)
CO2: 25 mmol/L (ref 22–32)
Calcium: 9.3 mg/dL (ref 8.9–10.3)
Chloride: 104 mmol/L (ref 98–111)
Creatinine, Ser: 0.73 mg/dL (ref 0.44–1.00)
GFR, Estimated: >60 mL/min (ref >60)
Glucose, Bld: 127 mg/dL — ABNORMAL HIGH (ref 70–99)
Potassium: 3.7 mmol/L (ref 3.5–5.1)
Sodium: 140 mmol/L (ref 135–145)
Total Bilirubin: 0.5 mg/dL (ref 0.3–1.2)
Total Protein: 6.1 g/dL — ABNORMAL LOW (ref 6.5–8.1)

## 2020-07-23 LAB — URINALYSIS, ROUTINE W REFLEX MICROSCOPIC
Bilirubin Urine: NEGATIVE
Glucose, UA: NEGATIVE mg/dL
Hgb urine dipstick: NEGATIVE
Ketones, ur: NEGATIVE mg/dL
Leukocytes,Ua: NEGATIVE
Nitrite: NEGATIVE
Protein, ur: NEGATIVE mg/dL
Specific Gravity, Urine: 1.01 (ref 1.005–1.030)
pH: 5 (ref 5.0–8.0)

## 2020-07-23 LAB — CBC WITH DIFFERENTIAL/PLATELET
Abs Immature Granulocytes: 0.03 10*3/uL (ref 0.00–0.07)
Basophils Absolute: 0.1 10*3/uL (ref 0.0–0.1)
Basophils Relative: 1 %
Eosinophils Absolute: 0.1 10*3/uL (ref 0.0–0.5)
Eosinophils Relative: 2 %
HCT: 39.7 % (ref 36.0–46.0)
Hemoglobin: 13.1 g/dL (ref 12.0–15.0)
Immature Granulocytes: 1 %
Lymphocytes Relative: 28 %
Lymphs Abs: 1.7 10*3/uL (ref 0.7–4.0)
MCH: 31.7 pg (ref 26.0–34.0)
MCHC: 33 g/dL (ref 30.0–36.0)
MCV: 96.1 fL (ref 80.0–100.0)
Monocytes Absolute: 0.2 10*3/uL (ref 0.1–1.0)
Monocytes Relative: 4 %
Neutro Abs: 4 10*3/uL (ref 1.7–7.7)
Neutrophils Relative %: 64 %
Platelets: 183 10*3/uL (ref 150–400)
RBC: 4.13 MIL/uL (ref 3.87–5.11)
RDW: 13.3 % (ref 11.5–15.5)
WBC: 6.1 10*3/uL (ref 4.0–10.5)
nRBC: 0 % (ref 0.0–0.2)

## 2020-07-23 LAB — RAPID URINE DRUG SCREEN, HOSP PERFORMED
Amphetamines: NOT DETECTED
Barbiturates: NOT DETECTED
Benzodiazepines: POSITIVE — AB
Cocaine: NOT DETECTED
Opiates: NOT DETECTED
Tetrahydrocannabinol: POSITIVE — AB

## 2020-07-23 LAB — ETHANOL: Alcohol, Ethyl (B): 215 mg/dL — ABNORMAL HIGH (ref <10)

## 2020-07-23 LAB — TROPONIN I (HIGH SENSITIVITY)
Troponin I (High Sensitivity): 3 ng/L (ref <18)
Troponin I (High Sensitivity): 3 ng/L (ref <18)

## 2020-07-23 LAB — RESP PANEL BY RT-PCR (FLU A&B, COVID) ARPGX2
Influenza A by PCR: NEGATIVE
Influenza B by PCR: NEGATIVE
SARS Coronavirus 2 by RT PCR: NEGATIVE

## 2020-07-23 LAB — LACTIC ACID, PLASMA
Lactic Acid, Venous: 2.7 mmol/L (ref 0.5–1.9)
Lactic Acid, Venous: 1.6 mmol/L (ref 0.5–1.9)

## 2020-07-23 MED ORDER — SODIUM CHLORIDE 0.9 % IV BOLUS
1000.0000 mL | Freq: Once | INTRAVENOUS | Status: AC
  Administered 2020-07-23: 1000 mL via INTRAVENOUS

## 2020-07-23 NOTE — ED Notes (Signed)
Date and time results received: 07/23/20 2002 (use smartphrase ".now" to insert current time)  Test: Lactic acid Critical Value: 2.7  Name of Provider Notified: Whitney Post, [place]  Orders Received? Or Actions Taken?: Actions Taken: PA aware

## 2020-07-23 NOTE — ED Notes (Signed)
Discharge instructions provided to patient and family. Verbalized understanding. Alert and oriented. IV lock removed. Ambulated with steady gait out of ED.

## 2020-07-23 NOTE — ED Triage Notes (Signed)
Patient brought in by ems after her sister spoke to her and noted slurred speech. Patient reports etoh use today and has been drinking brandy and gingerale. EMS report patient was found on her back outside but patient doesn't recall being outside. Patient is alert and oriented to person, place, and time but doesn't know about being outside.

## 2020-07-23 NOTE — ED Provider Notes (Signed)
[facility] EMERGENCY DEPARTMENT Provider Note   CSN: [id number] Arrival date & time: 07/23/20  1802     History Chief Complaint  Patient presents with  . Alcohol Intoxication  . Altered Mental Status    Karina [name] is a 79 y.o. female.  HPI patient is a 79 year old with a history of GERD, hypertension, hypothyroidism, panic attacks, who presents the emergency department due to altered mental status. Patient intermittently drinks alcohol. History is obtained mostly from patient's daughter who is at bedside. Patient's daughter states that she was talking to her on the phone for about an hour. She states that her mother sounded distraught and was slurring her words. Her mother states that she drank about 2 alcoholic beverages earlier today. She drinks intermittently. She also takes Xanax as needed for anxiety. She states she took a Xanax this morning. Her daughter states that after getting off the phone she was unable to reach her again and called EMS. Her mother was found unconscious on the back porch. EMS notes dysarthria. Patient additionally complains of intermittent left-sided chest pain for the past few weeks. No other complaints.  Level 5 caveat due to intoxication    Past Medical History:  Diagnosis Date  . Complication of anesthesia   . Diverticulosis   . Family history of adverse reaction to anesthesia    sister-N/V  . GERD (gastroesophageal reflux disease)   . Hiatal hernia   . HTN (hypertension)   . Hypercholesterolemia   . Hypothyroidism   . Melanoma ([place]) 1978   chemo  . Osteoporosis   . Panic attacks   . PONV (postoperative nausea and vomiting)     Patient Active Problem List   Diagnosis Date Noted  . Low back pain 01/28/2020  . 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 Primary osteoarthritis of right hip 03/11/2018    Past Surgical History:  Procedure Laterality Date  . APPENDECTOMY    . BASAL CELL CARCINOMA EXCISION    .  OVARIAN CYST REMOVAL     x 2  . SHOULDER SURGERY Bilateral R-2006, L-2004  . THYROIDECTOMY, PARTIAL    . TOTAL ABDOMINAL HYSTERECTOMY W/ BILATERAL SALPINGOOPHORECTOMY    . TOTAL HIP ARTHROPLASTY Right 05/07/2018  . TOTAL HIP ARTHROPLASTY Right 05/07/2018   Procedure: RIGHT TOTAL HIP ARTHROPLASTY;  Surgeon: Garald Balding, MD;  Location: [place];  Service: Orthopedics;  Laterality: Right;     OB History   No obstetric history on file.     History reviewed. No pertinent family history.  Social History   Tobacco Use  . Smoking status: Former Smoker    Years: 35.00    Types: Cigarettes  . Smokeless tobacco: Never Used  Vaping Use  . Vaping Use: Never used  Substance Use Topics  . Alcohol use: No  . Drug use: No    Home Medications Prior to Admission medications   Medication Sig Start Date End Date Taking? Authorizing Provider  acetaminophen (TYLENOL) 325 MG tablet Take 2 tablets (650 mg total) by mouth every 6 (six) hours. 05/09/18   Cherylann Ratel, PA-C  ALPRAZolam Duanne Moron) 0.5 MG tablet Take 0.5 mg by mouth 2 (two) times daily as needed for anxiety.  03/30/16   [provider]  aspirin 81 MG chewable tablet Chew 1 tablet (81 mg total) by mouth 2 (two) times daily. 05/09/18   Cherylann Ratel, PA-C  atenolol (TENORMIN) 50 MG tablet Take 50 mg by mouth daily. 02/13/16   [provider]  calcium carbonate (CALCIUM 600) 600 MG TABS tablet Take by mouth.    [provider]  ergocalciferol (VITAMIN D2) 50000 units capsule Take 50,000 Units by mouth 2 (two) times a week.    [provider]  Meth-Hyo-M Bl-Na Phos-Ph Sal (URIBEL) 118 MG CAPS Take 118 mg by mouth daily as needed (for bladder).     [provider]  methocarbamol (ROBAXIN) 500 MG tablet Take 1 tablet (500 mg total) by mouth every 8 (eight) hours as needed for muscle spasms. 05/09/18   Cherylann Ratel, PA-C  olmesartan-hydrochlorothiazide (BENICAR HCT) 20-12.5 MG tablet Take  1 tablet by mouth daily.    [provider]  oxyCODONE (OXY IR/ROXICODONE) 5 MG immediate release tablet Take 1 tablet (5 mg total) by mouth every 3 (three) hours as needed for moderate pain (pain score 4-6). 05/09/18   Cherylann Ratel, PA-C  risedronate (ACTONEL) 150 MG tablet Take 150 mg by mouth every 30 (thirty) days. 03/13/16   [provider]  sertraline (ZOLOFT) 100 MG tablet Take 100 mg by mouth every morning. 03/07/16   [provider]  SYNTHROID 75 MCG tablet Take 75 mcg by mouth daily before breakfast. 03/07/16   [provider]  traMADol (ULTRAM) 50 MG tablet  06/10/18   [provider]  ZETIA 10 MG tablet Take 10 mg by mouth at bedtime.  04/16/16   [provider]    Allergies    Morphine and Propoxyphene hcl  Review of Systems   Review of Systems  Unable to perform ROS: Other   Physical Exam Updated Vital Signs BP 96/69   Pulse 67   Temp (!) 97.2 F (36.2 C) (Oral)   Resp 19   SpO2 95%   Physical Exam Vitals and nursing note reviewed.  Constitutional:      General: She is not in acute distress.    Appearance: Normal appearance. She is not ill-appearing, toxic-appearing or diaphoretic.  HENT:     Head: Normocephalic and atraumatic.     Right Ear: External ear normal.     Left Ear: External ear normal.     Nose: Nose normal.     Mouth/Throat:     Mouth: Mucous membranes are moist.     Pharynx: Oropharynx is clear. No oropharyngeal exudate or posterior oropharyngeal erythema.  Eyes:     Extraocular Movements: Extraocular movements intact.  Cardiovascular:     Rate and Rhythm: Normal rate and regular rhythm.     Pulses: Normal pulses.     Heart sounds: Normal heart sounds. No murmur heard. No friction rub. No gallop.   Pulmonary:     Effort: Pulmonary effort is normal. No respiratory distress.     Breath sounds: Normal breath sounds. No stridor. No wheezing, rhonchi or rales.  Abdominal:     General:  Abdomen is flat.     Palpations: Abdomen is soft.     Tenderness: There is no abdominal tenderness.  Musculoskeletal:        General: Normal range of motion.     Cervical back: Normal range of motion and neck supple. No tenderness.  Skin:    General: Skin is warm and dry.  Neurological:     General: No focal deficit present.     Mental Status: She is alert and oriented to person, place, and time.     Comments: Patient is visibly intoxicated and slurring her words. Patient is oriented to person, place, and time. Strength  is 5/5 in all four extremities. Distal sensation intact in all four extremities. No focal weakness noted. No facial droop.   Psychiatric:        Mood and Affect: Mood normal.        Behavior: Behavior normal.    ED Results / Procedures / Treatments   Labs (all labs ordered are listed, but only abnormal results are displayed) Labs Reviewed  COMPREHENSIVE METABOLIC PANEL - Abnormal; Notable for the following components:      Result Value   Glucose, Bld 127 (*)    Total Protein 6.1 (*)    All other components within normal limits  ETHANOL - Abnormal; Notable for the following components:   Alcohol, Ethyl (B) 215 (*)    All other components within normal limits  LACTIC ACID, PLASMA - Abnormal; Notable for the following components:   Lactic Acid, Venous 2.7 (*)    All other components within normal limits  URINALYSIS, ROUTINE W REFLEX MICROSCOPIC - Abnormal; Notable for the following components:   Bacteria, UA RARE (*)    All other components within normal limits  RAPID URINE DRUG SCREEN, HOSP PERFORMED - Abnormal; Notable for the following components:   Benzodiazepines POSITIVE (*)    Tetrahydrocannabinol POSITIVE (*)    All other components within normal limits  RESP PANEL BY RT-PCR (FLU A&B, COVID) ARPGX2  CBC WITH DIFFERENTIAL/PLATELET  LACTIC ACID, PLASMA  BLOOD GAS, VENOUS  TROPONIN I (HIGH SENSITIVITY)  TROPONIN I (HIGH SENSITIVITY)   EKG EKG  Interpretation  Date/Time:  Friday July 23 2020 20:20:44 EST Ventricular Rate:  62 PR Interval:  148 QRS Duration: 68 QT Interval:  420 QTC Calculation: 426 R Axis:     Text Interpretation: Normal sinus rhythm Low voltage QRS Nonspecific ST and T wave abnormality Abnormal ECG Confirmed by Gerlene Fee [phone]) on 07/23/2020 10:24:41 PM   Radiology DG Chest 2 View  Result Date: 07/23/2020 CLINICAL DATA:  Fall EXAM: CHEST - 2 VIEW COMPARISON:  None. FINDINGS: The heart size and mediastinal contours are within normal limits. Aortic calcifications are seen. Both lungs are clear. The visualized skeletal structures are unremarkable. IMPRESSION: No active cardiopulmonary disease. Electronically Signed   By: Prudencio Pair M.D.   On: 07/23/2020 21:31   CT Head Wo Contrast  Result Date: 07/23/2020 CLINICAL DATA:  Mental status change, slurred speech. Alcohol/drug use. EXAM: CT HEAD WITHOUT CONTRAST TECHNIQUE: Contiguous axial images were obtained from the base of the skull through the vertex without intravenous contrast. COMPARISON:  Head CT 03/28/2017 FINDINGS: Brain: No intracranial hemorrhage, mass effect, or midline shift. Normal for age atrophy. Mild chronic small vessel ischemia. Remote lacunar infarct versus prominent perivascular space in the right basal ganglia. Stable bilateral basal gangliar mineralization, incidental and often senescent. No hydrocephalus. The basilar cisterns are patent. No evidence of territorial infarct or acute ischemia. No extra-axial or intracranial fluid collection. Vascular: No hyperdense vessel. Skull: No fracture or focal lesion. Sinuses/Orbits: Paranasal sinuses and mastoid air cells are clear. The visualized orbits are unremarkable. Bilateral cataract resection. Other: None. IMPRESSION: 1. No acute intracranial abnormality. 2. Stable age related atrophy and chronic small vessel ischemia. Electronically Signed   By: Keith Rake M.D.   On: 07/23/2020 19:40    CT Cervical Spine Wo Contrast  Result Date: 07/23/2020 CLINICAL DATA:  Neck trauma, intoxicated or obtunded (Age >= 16y) EXAM: CT CERVICAL SPINE WITHOUT CONTRAST TECHNIQUE: Multidetector CT imaging of the cervical spine was performed without intravenous contrast. Multiplanar CT image reconstructions  were also generated. COMPARISON:  None. FINDINGS: Alignment: Minimal anterolisthesis of C4 on C5 and C7 on T1, likely degenerative and facet mediated. No jumped facets or traumatic subluxation. Skull base and vertebrae: No acute fracture. Vertebral body heights are maintained. The dens and skull base are intact. Scattered lucencies throughout the vertebral bodies are felt to be related to underlying osteopenia. There is no destructive lesion. Soft tissues and spinal canal: No prevertebral fluid or swelling. No visible canal hematoma. Disc levels: Degenerative disc disease at C3-C4, C5-C6, and C6-C7. There is multilevel facet hypertrophy. Scattered bony neural foraminal stenosis. No central canal narrowing. Upper chest: No acute or unexpected findings. Other: None. IMPRESSION: 1. No acute fracture or subluxation of the cervical spine. 2. Multilevel degenerative disc disease and facet hypertrophy. Electronically Signed   By: Narda Rutherford M.D.   On: 07/23/2020 19:43   Procedures Procedures (including critical care time)  Medications Ordered in ED Medications  sodium chloride 0.9 % bolus 1,000 mL (0 mLs Intravenous Stopped 07/23/20 2134)   ED Course  I have reviewed the triage vital signs and the nursing notes.  Pertinent labs & imaging results that were available during my care of the patient were reviewed by me and considered in my medical decision making (see chart for details).  Clinical Course as of 07/23/20 2223  Fri Jul 23, 2020  2010 Alcohol, Ethyl (B)(!): 215 [LJ]  2159 Tetrahydrocannabinol(!): POSITIVE [LJ]  2159 Benzodiazepines(!): POSITIVE [LJ]  2159 Lactic Acid, Venous: 1.6 [LJ]     Clinical Course User Index [LJ] Placido Sou, PA-C   MDM Rules/Calculators/A&P                          Patient is a 79 year old female who presents the emergency department due to what appears to be an acute intoxication.  Patient was found unconscious on her back porch.  Slurring her words.  Similar findings on my exam.  Patient appears acutely intoxicated but besides slurring of her words her neurological exam was benign.  I obtained a CT of the head and neck which was reassuring.  Chest x-ray as well was reassuring.  I obtained basic labs and gave patient a liter of IV fluids.  UA showing rare bacteria but otherwise negative.  No elevation in troponin at 3.  UDS positive for benzodiazepines as well as THC.  Patient notes PRN Xanax use but most recently took Xanax this morning prior to drinking alcohol.  Covid test is negative.  Ethanol elevated at 215.  Initial lactic acid of 2.7.  No anion gap.  After IV fluids this was remeasured and has normalized at 1.6.  Patient has ambulated throughout the emergency department unassisted.  She was p.o. challenged without issue.  Feel the patient is safe for discharge at this time.  Patient was discussed with and evaluated by my attending physician as well.  Discussed return precautions with patient and her daughter at length.  They are eager to be discharged.  Their questions were answered and they were amicable at the time of discharge.  Patient's vital signs are stable.  Final Clinical Impression(s) / ED Diagnoses Final diagnoses:  Alcoholic intoxication without complication [facility])    Rx / DC Orders ED Discharge Orders    None       Placido Sou, PA-C 07/23/20 2226    Sabas Sous, MD 07/23/20 2350

## 2020-07-23 NOTE — Discharge Instructions (Addendum)
Like we discussed, please return Karina [name] to the emergency department with any new or worsening symptoms.  Otherwise, follow-up with her primary doctor.  It was a pleasure to meet you both.

## 2020-07-23 NOTE — ED Notes (Signed)
Patient transported to CT 

## 2020-07-23 NOTE — ED Notes (Signed)
Patient transported to X-ray 

## 2020-07-23 NOTE — ED Notes (Signed)
Patient ambulated with steady gait to and from bathroom to provide urine sample.

## 2020-11-25 ENCOUNTER — Encounter (HOSPITAL_COMMUNITY): Payer: Self-pay

## 2020-11-25 ENCOUNTER — Emergency Department (HOSPITAL_COMMUNITY): Payer: Medicare Other

## 2020-11-25 ENCOUNTER — Other Ambulatory Visit: Payer: Self-pay

## 2020-11-25 ENCOUNTER — Emergency Department (HOSPITAL_COMMUNITY)
Admission: EM | Admit: 2020-11-25 | Discharge: 2020-11-26 | Disposition: A | Discharge status: Home / Self Care | Payer: Medicare Other | Attending: Emergency Medicine | Admitting: Emergency Medicine

## 2020-11-25 DIAGNOSIS — S022XXA Fracture of nasal bones, initial encounter for closed fracture: Secondary | ICD-10-CM

## 2020-11-25 DIAGNOSIS — S80212A Abrasion, left knee, initial encounter: Secondary | ICD-10-CM | POA: Diagnosis not present

## 2020-11-25 DIAGNOSIS — I10 Essential (primary) hypertension: Secondary | ICD-10-CM | POA: Diagnosis not present

## 2020-11-25 DIAGNOSIS — Y9302 Activity, running: Secondary | ICD-10-CM | POA: Insufficient documentation

## 2020-11-25 DIAGNOSIS — Z79899 Other long term (current) drug therapy: Secondary | ICD-10-CM | POA: Insufficient documentation

## 2020-11-25 DIAGNOSIS — W1830XA Fall on same level, unspecified, initial encounter: Secondary | ICD-10-CM | POA: Insufficient documentation

## 2020-11-25 DIAGNOSIS — W19XXXA Unspecified fall, initial encounter: Secondary | ICD-10-CM

## 2020-11-25 DIAGNOSIS — S80211A Abrasion, right knee, initial encounter: Secondary | ICD-10-CM | POA: Diagnosis not present

## 2020-11-25 DIAGNOSIS — Z96641 Presence of right artificial hip joint: Secondary | ICD-10-CM | POA: Insufficient documentation

## 2020-11-25 DIAGNOSIS — Z87891 Personal history of nicotine dependence: Secondary | ICD-10-CM | POA: Insufficient documentation

## 2020-11-25 DIAGNOSIS — S0993XA Unspecified injury of face, initial encounter: Secondary | ICD-10-CM | POA: Diagnosis present

## 2020-11-25 DIAGNOSIS — E039 Hypothyroidism, unspecified: Secondary | ICD-10-CM | POA: Insufficient documentation

## 2020-11-25 DIAGNOSIS — Z7982 Long term (current) use of aspirin: Secondary | ICD-10-CM | POA: Insufficient documentation

## 2020-11-25 MED ORDER — TETANUS-DIPHTH-ACELL PERTUSSIS 5-2.5-18.5 LF-MCG/0.5 IM SUSY: 0.5000 mL | PREFILLED_SYRINGE | Freq: Once | INTRAMUSCULAR | Status: DC
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
## 2020-11-25 NOTE — ED Triage Notes (Signed)
Fell face forward - abrasions to bilateral knees, abrasion to nose, and hematoma to right frontal area.    Only takes 81mg  aspirin. Denies any LOC.
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
## 2020-11-26 NOTE — ED Notes (Signed)
Patient verbalized understanding of discharge instructions. Opportunity for questions and answers.  

## 2021-01-20 ENCOUNTER — Other Ambulatory Visit: Payer: Self-pay | Admitting: Urology

## 2021-02-16 ENCOUNTER — Encounter (HOSPITAL_BASED_OUTPATIENT_CLINIC_OR_DEPARTMENT_OTHER): Payer: Self-pay | Admitting: Urology

## 2021-02-16 ENCOUNTER — Other Ambulatory Visit: Payer: Self-pay

## 2021-02-16 NOTE — Progress Notes (Signed)
Spoke w/ via phone for pre-op interview--- Pt Lab needs dos----    Istat           Lab results------ current ekg in epic/ chart COVID test -----patient states asymptomatic no test needed Arrive at ------- 0915 on 02-22-2021 NPO after MN NO Solid Food.  Clear liquids from MN until--- 0815 Med rec completed Medications to take morning of surgery ----- Synthroid, Prilosec, Atenolol, Zoloft Diabetic medication ----- n/a Patient instructed no nail polish to be worn day of surgery Patient instructed to bring photo id and insurance card day of surgery Patient aware to have Driver (ride ) / caregiver for 24 hours after surgery --sister, Ailynn Renninger Patient Special Instructions ----- n/a Pre-Op special Istructions ----- n/a Patient verbalized understanding of instructions that were given at this phone interview. Patient denies shortness of breath, chest pain, fever, cough at this phone interview.

## 2021-02-21 NOTE — H&P (Signed)
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     01/19/21: Karina [name] returns today in f/u. She has IC and is having increased frequency, intermittency and pain. She has had prior cystoscopy and HOD by Dr. Karsten [name] with good relief several years ago. She has some vaginal burning with voiding. She has nocturia 2-3x. She has some SUI. She has some UUI at times. She has had no hematuria.   4854:27 80-year-old female with the below noted past medical history. She presents today for bladder instillation. She is currently undergoing a lot of stress with her son and reports that she has been having some discomfort in her interstitial cystitis is acting up. Her urinalysis today is clear.   05/11/2020: The in does presents today requesting a bladder instillation. She reports that these help her. She is having difficulty with pelvic pain and dysuria this is been intermittent for the last few weeks. She reports increased nocturia. She denies fevers, chills, flank pain.   Karina [name] returns today in f/u. She has suprapubic pain that has been relieved with the anesthetic cocktails. She last was treated on 07/10/19. She strains to void but she has a continuous dribbling and a spraying stream. She has a clear urine today.      ALLERGIES: [place] CAPS - Itching Morphine Derivatives - Itching    MEDICATIONS: Actonel 150 mg tablet Oral  Alprazolam 0.5 mg tablet Oral  Aspirin 81 MG TABS Oral  Atenolol 50 mg tablet Oral  Calcium + D TABS Oral  Celebrex 200 mg capsule Oral  Fluticasone Propionate 50 mcg/actuation spray, suspension Nasal  Halobetasol Propionate 0.05 % ointment External  Olmesartan-Hydrochlorothiazide  PriLOSEC 20 MG Oral Capsule Delayed Release Oral  [place]-50 50 % solution, non-oral 0 Intravesical  [place]-50 Ml + Sodium Bicarbonate 8.4% 50 Ml For Total 100 Ml  Sertraline HCl - 100 MG Oral Tablet Oral  Synthroid 88 mcg tablet  Uribel  Vitamin B12  Vitamin D  Zetia 10 MG Oral Tablet Oral     GU PSH: Cystoscopy -  12/18/2019 Hysterectomy Unilat SO - 2008 Initial Female Urethral Dilation - 03/12/2019 Locm 300-'399Mg'$ /Ml Iodine,1Ml - 12/15/2019       PSH Notes: subtotal thyroidectomy   NON-GU PSH: Appendectomy - 2008 Diagnostic Colonoscopy - 2016 Hip Replacement, Right - 2019 Remove Thyroid - 2008 Remove Tonsils - 2008 Shoulder Surgery (Unspecified) Wrist Arthroscopy/surgery, Left     GU PMH: Interstitial Cystitis (w/o hematuria) - 01/06/2021, - 05/19/2020, - 05/11/2020, She will have an anesthetic cocktail today. , - 07/28/2019, She will continue the anesthetic cocktails. 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 Continue IC treatments per routine. - 12/18/2019 Nocturia - 07/28/2019 Urge incontinence, I have given her additional samples of Myrbetriq and discussed PTNS. F/U in 6 months. - 07/28/2019, She has had some progressive symptoms. I will get her back on the Myrbetriq., - 03/12/2019, - 2020, - 2020, - 2020, - 2020, Urge incontinence of urine, - 2014 Meatal stenosis (other urethral stricture), She was dilated today. - 03/12/2019 Other microscopic hematuria, Microscopic hematuria - 2014 Weak Urinary Stream, Weak urinary stream - 2014      PMH Notes: .   NON-GU PMH: Encounter for general adult medical examination without abnormal findings, Encounter for preventive health examination - 2017 Personal history of other diseases of the digestive system, History of gastritis - 2016, History of hiatal hernia, - 2016 Skin Cancer, History, History of malignant neoplasm of skin - 2015 Personal history of other  diseases of the circulatory system, History of hypertension - 2014 Personal history of other endocrine, nutritional and metabolic disease, History of hypercholesterolemia - 2014 Congenital hiatus hernia Malignant melanoma of skin, unspecified    FAMILY HISTORY: Arthritis - Mother Bladder Cancer - Father Cancer - Runs In  Family Chronic Interstitial Cystitis - Sister Congestive Heart Failure - Mother Death - Mother, Father Diabetes - Sister, Mother Heart Disease - Mother Hypertension - Mother Malignant Brain Tumor - Father   SOCIAL HISTORY: Marital Status: Married Preferred Language: English; Race: White Current Smoking Status: Patient smokes occasionally.  Does not use smokeless tobacco. Has never drank.  Does not use drugs. Drinks 2 caffeinated drinks per day. Has not had a blood transfusion. Patient's occupation is/was retired.    REVIEW OF SYSTEMS:    GU Review Female:   dribbling. Patient reports frequent urination and leakage of urine. Patient denies hard to postpone urination, burning /pain with urination, get up at night to urinate, stream starts and stops, trouble starting your stream, have to strain to urinate, and being pregnant.  Gastrointestinal (Upper):   Patient denies nausea, vomiting, and indigestion/ heartburn.  Gastrointestinal (Lower):   Patient denies diarrhea and constipation.  Constitutional:   Patient denies fever, night sweats, weight loss, and fatigue.  Skin:   Patient denies skin rash/ lesion and itching.  Eyes:   Patient denies blurred vision and double vision.  Ears/ Nose/ Throat:   Patient denies sinus problems and sore throat.  Hematologic/Lymphatic:   Patient denies swollen glands and easy bruising.  Cardiovascular:   Patient denies leg swelling and chest pains.  Respiratory:   Patient denies cough and shortness of breath.  Endocrine:   Patient denies excessive thirst.  Musculoskeletal:   Patient denies back pain and joint pain.  Neurological:   Patient denies headaches and dizziness.  Psychologic:   Patient denies depression and anxiety.   VITAL SIGNS: None   MULTI-SYSTEM PHYSICAL EXAMINATION:    Constitutional: Well-nourished. No physical deformities. Normally developed. Good grooming.  Respiratory: Normal breath sounds. No labored breathing, no use of  accessory muscles.   Cardiovascular: Regular rate and rhythm. No murmur, no gallop.   Gastrointestinal: Abdominal tenderness, suprapubic. No mass, no rigidity, non obese abdomen.      Complexity of Data:  Records Review:   Previous Patient Records  Urine Test Review:   Urinalysis   PROCEDURES:         PVR Ultrasound - [id number]  Scanned Volume: 0 cc         Anesthetic Cocktail - 51700 Patient cathed with a 14 fr clear in and out catheter and bladder completely drained. 20cc of 2% Lidocaine, 5cc of Sodium Bicarbonate, and '100mg'$  of Solu-Cortef was instilled into patient's bladder without difficulty. 10 cc of tetracaine instilled.           Urinalysis Dipstick Dipstick Cont'd  Color: Yellow Bilirubin: Neg mg/dL  Appearance: Clear Ketones: Neg mg/dL  Specific Gravity: 1.025 Blood: Neg ery/uL  pH: 5.5 Protein: Trace mg/dL  Glucose: Neg mg/dL Urobilinogen: 0.2 mg/dL    Nitrites: Neg    Leukocyte Esterase: Neg leu/uL         Ketoralac '30mg'$  - [id number], [id number] Qty: 30 Adm. By: Jairo Ben Dezantil  Unit: mg Lot No [id number]  Route: IM Exp. Date 06/23/2021  Freq: None Mfgr.:   Site: Right Hip   ASSESSMENT:      ICD-10 Details  1 GU:   Interstitial Cystitis (w/o hematuria) - N30.10 Chronic, Worsening - She  has increased frequency, nocturia and suprapubic pain despite anesthetic cocktails. She has had prior relief with HOD so I will set her up for cystoscopy, urethral dilation, HOD and instillation of P&M. risks reviewed in detail.  2   Suprapubic pain - R39.82 Chronic, Worsening  3   Weak Urinary Stream - R39.12 Chronic, Worsening  4   Nocturia - R35.1 Chronic, Stable   PLAN:           Schedule Return Visit/Planned Activity: Next Available Appointment - Schedule Surgery  Procedure: 01/19/2021 at [facility] - [phone] - Ketoralac '30mg'$  (Toradol Per 15 Mg) - [id number], [id number]  Procedure: Unspecified Date - Cystoscopy Hydrodistention [id number] Notes: Next available

## 2021-02-22 ENCOUNTER — Other Ambulatory Visit: Payer: Self-pay

## 2021-02-22 ENCOUNTER — Ambulatory Visit (HOSPITAL_BASED_OUTPATIENT_CLINIC_OR_DEPARTMENT_OTHER)
Admission: RE | Admit: 2021-02-22 | Discharge: 2021-02-22 | Disposition: A | Discharge status: Home / Self Care | Payer: Medicare Other | Attending: Urology | Admitting: Urology

## 2021-02-22 ENCOUNTER — Encounter (HOSPITAL_BASED_OUTPATIENT_CLINIC_OR_DEPARTMENT_OTHER)
Admission: RE | Disposition: A | Discharge status: Home / Self Care | Payer: Self-pay | Source: Home / Self Care | Attending: Urology

## 2021-02-22 ENCOUNTER — Ambulatory Visit (HOSPITAL_BASED_OUTPATIENT_CLINIC_OR_DEPARTMENT_OTHER): Payer: Medicare Other | Admitting: Anesthesiology

## 2021-02-22 ENCOUNTER — Encounter (HOSPITAL_BASED_OUTPATIENT_CLINIC_OR_DEPARTMENT_OTHER): Payer: Self-pay | Admitting: Urology

## 2021-02-22 DIAGNOSIS — Z85820 Personal history of malignant melanoma of skin: Secondary | ICD-10-CM | POA: Diagnosis not present

## 2021-02-22 DIAGNOSIS — Z8052 Family history of malignant neoplasm of bladder: Secondary | ICD-10-CM | POA: Insufficient documentation

## 2021-02-22 DIAGNOSIS — Z808 Family history of malignant neoplasm of other organs or systems: Secondary | ICD-10-CM | POA: Diagnosis not present

## 2021-02-22 DIAGNOSIS — Z96641 Presence of right artificial hip joint: Secondary | ICD-10-CM | POA: Insufficient documentation

## 2021-02-22 DIAGNOSIS — Z8379 Family history of other diseases of the digestive system: Secondary | ICD-10-CM | POA: Diagnosis not present

## 2021-02-22 DIAGNOSIS — N3010 Interstitial cystitis (chronic) without hematuria: Secondary | ICD-10-CM | POA: Diagnosis not present

## 2021-02-22 DIAGNOSIS — Z809 Family history of malignant neoplasm, unspecified: Secondary | ICD-10-CM | POA: Diagnosis not present

## 2021-02-22 DIAGNOSIS — Z90710 Acquired absence of both cervix and uterus: Secondary | ICD-10-CM | POA: Insufficient documentation

## 2021-02-22 DIAGNOSIS — Z885 Allergy status to narcotic agent status: Secondary | ICD-10-CM | POA: Diagnosis not present

## 2021-02-22 DIAGNOSIS — Z8249 Family history of ischemic heart disease and other diseases of the circulatory system: Secondary | ICD-10-CM | POA: Diagnosis not present

## 2021-02-22 DIAGNOSIS — R351 Nocturia: Secondary | ICD-10-CM | POA: Diagnosis not present

## 2021-02-22 DIAGNOSIS — F17200 Nicotine dependence, unspecified, uncomplicated: Secondary | ICD-10-CM | POA: Diagnosis not present

## 2021-02-22 DIAGNOSIS — R3912 Poor urinary stream: Secondary | ICD-10-CM | POA: Insufficient documentation

## 2021-02-22 HISTORY — PX: CYSTOSCOPY WITH URETHRAL DILATATION: SHX5125

## 2021-02-22 HISTORY — DX: Complete loss of teeth, unspecified cause, unspecified class: K08.109

## 2021-02-22 HISTORY — DX: Personal history of other mental and behavioral disorders: Z86.59

## 2021-02-22 HISTORY — DX: Chronic bladder pain: R39.82

## 2021-02-22 HISTORY — DX: Presence of spectacles and contact lenses: Z97.3

## 2021-02-22 HISTORY — DX: Interstitial cystitis (chronic) without hematuria: N30.10

## 2021-02-22 HISTORY — DX: Postprocedural hypothyroidism: E89.0

## 2021-02-22 HISTORY — DX: Other chronic pain: G89.29

## 2021-02-22 HISTORY — DX: Complete loss of teeth, unspecified cause, unspecified class: Z97.2

## 2021-02-22 HISTORY — DX: Low back pain, unspecified: M54.50

## 2021-02-22 HISTORY — DX: Unspecified osteoarthritis, unspecified site: M19.90

## 2021-02-22 LAB — POCT I-STAT, CHEM 8
BUN: 14 mg/dL (ref 8–23)
Calcium, Ion: 1.3 mmol/L (ref 1.15–1.40)
Chloride: 104 mmol/L (ref 98–111)
Creatinine, Ser: 0.7 mg/dL (ref 0.44–1.00)
Glucose, Bld: 94 mg/dL (ref 70–99)
HCT: 42 % (ref 36.0–46.0)
Hemoglobin: 14.3 g/dL (ref 12.0–15.0)
Potassium: 3.5 mmol/L (ref 3.5–5.1)
Sodium: 142 mmol/L (ref 135–145)
TCO2: 25 mmol/L (ref 22–32)

## 2021-02-22 SURGERY — CYSTOSCOPY, WITH URETHRAL DILATION
Anesthesia: General

## 2021-02-22 MED ORDER — PROPOFOL 10 MG/ML IV BOLUS
INTRAVENOUS | Status: DC | PRN
Start: 2021-02-22 — End: 2021-02-22
  Administered 2021-02-22: 150 mg via INTRAVENOUS

## 2021-02-22 MED ORDER — SODIUM CHLORIDE 0.9% FLUSH: 3.0000 mL | Freq: Two times a day (BID) | INTRAVENOUS | Status: DC

## 2021-02-22 MED ORDER — ONDANSETRON HCL 4 MG/2ML IJ SOLN
INTRAMUSCULAR | Status: AC
  Filled 2021-02-22: qty 2

## 2021-02-22 MED ORDER — SODIUM CHLORIDE 0.9 % IV SOLN
2.0000 g | INTRAVENOUS | Status: AC
  Administered 2021-02-22: 2 g via INTRAVENOUS

## 2021-02-22 MED ORDER — PROPOFOL 10 MG/ML IV BOLUS
INTRAVENOUS | Status: AC
  Filled 2021-02-22: qty 20

## 2021-02-22 MED ORDER — LIDOCAINE HCL (PF) 2 % IJ SOLN
INTRAMUSCULAR | Status: AC
  Filled 2021-02-22: qty 5

## 2021-02-22 MED ORDER — LACTATED RINGERS IV SOLN: INTRAVENOUS | Status: DC

## 2021-02-22 MED ORDER — ACETAMINOPHEN 500 MG PO TABS
ORAL_TABLET | ORAL | Status: AC
  Filled 2021-02-22: qty 2

## 2021-02-22 MED ORDER — ACETAMINOPHEN 500 MG PO TABS
1000.0000 mg | ORAL_TABLET | Freq: Once | ORAL | Status: AC
  Administered 2021-02-22: 1000 mg via ORAL

## 2021-02-22 MED ORDER — BUPIVACAINE HCL 0.5 % IJ SOLN
INTRAMUSCULAR | Status: DC | PRN
  Administered 2021-02-22: 15 mL

## 2021-02-22 MED ORDER — STERILE WATER FOR IRRIGATION IR SOLN
Status: DC | PRN
  Administered 2021-02-22: 3000 mL

## 2021-02-22 MED ORDER — ONDANSETRON HCL 4 MG/2ML IJ SOLN
INTRAMUSCULAR | Status: DC | PRN
  Administered 2021-02-22: 4 mg via INTRAVENOUS

## 2021-02-22 MED ORDER — BELLADONNA ALKALOIDS-OPIUM 16.2-60 MG RE SUPP
RECTAL | Status: AC
  Filled 2021-02-22: qty 1

## 2021-02-22 MED ORDER — PHENAZOPYRIDINE HCL 100 MG PO TABS
ORAL_TABLET | ORAL | Status: DC | PRN
  Administered 2021-02-22: 400 mg

## 2021-02-22 MED ORDER — DEXAMETHASONE SODIUM PHOSPHATE 10 MG/ML IJ SOLN
INTRAMUSCULAR | Status: DC | PRN
  Administered 2021-02-22: 5 mg via INTRAVENOUS

## 2021-02-22 MED ORDER — FENTANYL CITRATE (PF) 100 MCG/2ML IJ SOLN
INTRAMUSCULAR | Status: DC | PRN
  Administered 2021-02-22: 25 ug via INTRAVENOUS

## 2021-02-22 MED ORDER — FENTANYL CITRATE (PF) 100 MCG/2ML IJ SOLN
INTRAMUSCULAR | Status: AC
  Filled 2021-02-22: qty 2

## 2021-02-22 MED ORDER — DEXAMETHASONE SODIUM PHOSPHATE 10 MG/ML IJ SOLN
INTRAMUSCULAR | Status: AC
  Filled 2021-02-22: qty 1

## 2021-02-22 MED ORDER — LIDOCAINE 2% (20 MG/ML) 5 ML SYRINGE
INTRAMUSCULAR | Status: DC | PRN
  Administered 2021-02-22: 60 mg via INTRAVENOUS

## 2021-02-22 MED ORDER — SODIUM CHLORIDE 0.9 % IV SOLN
INTRAVENOUS | Status: AC
  Filled 2021-02-22: qty 2

## 2021-02-22 MED ORDER — HYDROCODONE-ACETAMINOPHEN 5-325 MG PO TABS: 1.0000 | ORAL_TABLET | Freq: Four times a day (QID) | ORAL | 0 refills | Status: DC | PRN

## 2021-02-22 SURGICAL SUPPLY — 19 items
BAG DRAIN URO-CYSTO SKYTR STRL (DRAIN) ×2 IMPLANT
BAG DRN UROCATH (DRAIN) ×1
BALLN NEPHROSTOMY (BALLOONS) ×2
BALLOON NEPHROSTOMY (BALLOONS) ×1 IMPLANT
CLOTH BEACON ORANGE TIMEOUT ST (SAFETY) ×2 IMPLANT
GLOVE SURG ENC TEXT LTX SZ7 (GLOVE) ×2 IMPLANT
GLOVE SURG POLYISO LF SZ8 (GLOVE) ×2 IMPLANT
GOWN STRL REUS W/ TWL LRG LVL3 (GOWN DISPOSABLE) ×1 IMPLANT
GOWN STRL REUS W/TWL LRG LVL3 (GOWN DISPOSABLE) ×2
GOWN STRL REUS W/TWL XL LVL3 (GOWN DISPOSABLE) ×2 IMPLANT
KIT TURNOVER CYSTO (KITS) ×2 IMPLANT
MANIFOLD NEPTUNE II (INSTRUMENTS) ×2 IMPLANT
NDL SAFETY ECLIPSE 18X1.5 (NEEDLE) ×1 IMPLANT
NEEDLE HYPO 18GX1.5 SHARP (NEEDLE) ×2
PACK CYSTO (CUSTOM PROCEDURE TRAY) ×2 IMPLANT
SYR 20ML LL LF (SYRINGE) ×2 IMPLANT
SYR 30ML LL (SYRINGE) ×2 IMPLANT
TUBE CONNECTING 12X1/4 (SUCTIONS) ×2 IMPLANT
WATER STERILE IRR 3000ML UROMA (IV SOLUTION) ×2 IMPLANT

## 2021-02-22 NOTE — Transfer of Care (Signed)
Immediate Anesthesia Transfer of Care Note  Patient: Karina [name]  Procedure(s) Performed: Procedure(s) (LRB): CYSTOSCOPY WITH URETHRAL DILATATION HYDRODISTENTION OF BLADDER AND INSTILLATION OF MARICARE AND PYRIDIUM (N/A)  Patient Location: PACU  Anesthesia Type: General  Level of Consciousness: awake, oriented, sedated and patient cooperative  Airway & Oxygen Therapy: Patient Spontanous Breathing and Patient connected to face mask oxygen  Post-op Assessment: Report given to PACU RN and Post -op Vital signs reviewed and stable  Post vital signs: Reviewed and stable  Complications: No apparent anesthesia complications Last Vitals:  Vitals Value Taken Time  BP 133/85 02/22/21 1203  Temp 36.4 C 02/22/21 1203  Pulse 58 02/22/21 1207  Resp 14 02/22/21 1207  SpO2 100 % 02/22/21 1207  Vitals shown include unvalidated device data.  Last Pain:  Vitals:   02/22/21 0955  TempSrc: Oral  PainSc: 6       Patients Stated Pain Goal: 6 ([id number])  Complications: No notable events documented.

## 2021-02-22 NOTE — Op Note (Signed)
Procedure: Cystoscopy with hydrodistention of the bladder and instillation of Pyridium and Marcaine.  Preop diagnosis: Painful bladder with interstitial cystitis.  Postop diagnosis: Painful bladder with interstitial cystitis.  Surgeon: Dr. Irine Seal.  Anesthesia: General.  Specimen: None.  Drain: None.  EBL: None.  Complications: None.  Indications: Karina [name] is a 80 year old female who has frequency, intermittency and pain and previously did obtain relief from cystoscopy with hydrodistention she is to undergo that procedure again.  Procedure: She was given antibiotics.  She was taken operating room where general anesthetic was induced.  She was placed in lithotomy position and fitted with PAS hose.  Her perineum and genitalia were prepped with Betadine solution she was draped in usual sterile fashion.  Cystoscopy was performed using a 21 [place] scope and 30 degree lens.  Examination revealed a normal urethra.  The bladder wall was mildly trabeculated with no mucosal lesions.  Ureteral orifices were unremarkable.  The bladder was then filled to capacity under 80 cm of water pressure.  Upon draining she was noted to have a capacity of 550 mL.  We cystoscopy after drainage demonstrated some glomerulations of the right lateral wall and trigone consistent with her diagnosis of interstitial cystitis.  The bladder was drained and an 66 French Foley catheter was inserted.  The bladder was instilled with 15 mL of half percent Marcaine with 4 mg of crushed Pyridium.  The catheter was removed.  She was taken down from lithotomy position, her anesthetic was reversed and she was moved recovery in stable condition.  There were no complications.

## 2021-02-22 NOTE — Anesthesia Procedure Notes (Signed)
Procedure Name: LMA Insertion Date/Time: 02/22/2021 11:39 AM Performed by: Suan Halter, CRNA Pre-anesthesia Checklist: Patient identified, Emergency Drugs available, Suction available and Patient being monitored Patient Re-evaluated:Patient Re-evaluated prior to induction Oxygen Delivery Method: Circle system utilized Preoxygenation: Pre-oxygenation with 100% oxygen Induction Type: IV induction Ventilation: Mask ventilation without difficulty LMA: LMA inserted LMA Size: 4.0 Number of attempts: 1 Airway Equipment and Method: Bite block Placement Confirmation: positive ETCO2 Tube secured with: Tape Dental Injury: Teeth and Oropharynx as per pre-operative assessment

## 2021-02-22 NOTE — Anesthesia Postprocedure Evaluation (Signed)
Anesthesia Post Note  Patient: Verdis Frederickson  Procedure(s) Performed: CYSTOSCOPY WITH URETHRAL DILATATION HYDRODISTENTION OF BLADDER AND INSTILLATION OF [place] AND PYRIDIUM     Patient location during evaluation: PACU Anesthesia Type: General Level of consciousness: awake Pain management: pain level controlled Vital Signs Assessment: post-procedure vital signs reviewed and stable Respiratory status: spontaneous breathing and respiratory function stable Cardiovascular status: stable Postop Assessment: no apparent nausea or vomiting Anesthetic complications: no   No notable events documented.  Last Vitals:  Vitals:   02/22/21 1230 02/22/21 1312  BP: (!) 148/86 (!) 143/82  Pulse: (!) 59 (!) 58  Resp: 14 15  Temp:  (!) 36.3 C  SpO2: 98% 94%    Last Pain:  Vitals:   02/22/21 1312  TempSrc:   PainSc: 0-No pain                 Merlinda Frederick

## 2021-02-22 NOTE — Interval H&P Note (Signed)
History and Physical Interval Note:  02/22/2021 11:04 AM  Karina [name]  has presented today for surgery, with the diagnosis of INTERSTITIAL CYSTITIS.  The various methods of treatment have been discussed with the patient and family. After consideration of risks, benefits and other options for treatment, the patient has consented to  Procedure(s) with comments: CYSTOSCOPY WITH URETHRAL DILATATION HYDRODISTENTION OF BLADDER AND INSTILLATION OF MARICARE AND PYRIDIUM (N/A) - 30 MINS FOR CASE as a surgical intervention.  The patient's history has been reviewed, patient examined, no change in status, stable for surgery.  I have reviewed the patient's chart and labs.  Questions were answered to the patient's satisfaction.     Irine Seal

## 2021-02-22 NOTE — Discharge Instructions (Addendum)
CYSTOSCOPY HOME CARE INSTRUCTIONS  Activity: Rest for the remainder of the day.  Do not drive or operate equipment today.  You may resume normal activities in one to two days as instructed by your physician.   Meals: Drink plenty of liquids and eat light foods such as gelatin or soup this evening.  You may return to a normal meal plan tomorrow.  Return to Work: You may return to work in one to two days or as instructed by your physician.  Special Instructions / Symptoms: Call your physician if any of these symptoms occur:   -persistent or heavy bleeding  -bleeding which continues after first few urination  -large blood clots that are difficult to pass  -urine stream diminishes or stops completely  -fever equal to or higher than 101 degrees Farenheit.  -cloudy urine with a strong, foul odor  -severe pain  Females should always wipe from front to back after elimination.  You may feel some burning pain when you urinate.  This should disappear with time.  Applying moist heat to the lower abdomen or a hot tub bath may help relieve the pain. \     Post Anesthesia Home Care Instructions  Activity: Get plenty of rest for the remainder of the day. A responsible individual must stay with you for 24 hours following the procedure.  For the next 24 hours, DO NOT: -Drive a car -Paediatric nurse -Drink alcoholic beverages -Take any medication unless instructed by your physician -Make any legal decisions or sign important papers.  Meals: Start with liquid foods such as gelatin or soup. Progress to regular foods as tolerated. Avoid greasy, spicy, heavy foods. If nausea and/or vomiting occur, drink only clear liquids until the nausea and/or vomiting subsides. Call your physician if vomiting continues.  Special Instructions/Symptoms: Your throat may feel dry or sore from the anesthesia or the breathing tube placed in your throat during surgery. If this causes discomfort, gargle with warm salt  water. The discomfort should disappear within 24 hours.  If you had a scopolamine patch placed behind your ear for the management of post- operative nausea and/or vomiting:  1. The medication in the patch is effective for 72 hours, after which it should be removed.  Wrap patch in a tissue and discard in the trash. Wash hands thoroughly with soap and water. 2. You may remove the patch earlier than 72 hours if you experience unpleasant side effects which may include dry mouth, dizziness or visual disturbances. 3. Avoid touching the patch. Wash your hands with soap and water after contact with the patch.

## 2021-02-22 NOTE — Anesthesia Preprocedure Evaluation (Addendum)
Anesthesia Evaluation  Patient identified by MRN, date of birth, ID band Patient awake    Reviewed: Allergy & Precautions, NPO status , Patient's Chart, lab work & pertinent test results  Airway Mallampati: I  TM Distance: >3 FB Neck ROM: Full    Dental no notable dental hx. (+) Lower Dentures, Upper Dentures   Pulmonary neg pulmonary ROS, Patient abstained from smoking., former smoker,    Pulmonary exam normal breath sounds clear to auscultation       Cardiovascular hypertension, negative cardio ROS Normal cardiovascular exam Rhythm:Regular Rate:Normal     Neuro/Psych PSYCHIATRIC DISORDERS Anxiety negative neurological ROS     GI/Hepatic Neg liver ROS, hiatal hernia, GERD  ,  Endo/Other  Hypothyroidism   Renal/GU negative Renal ROS   Interstitial cystitis; chronic bladder pain    Musculoskeletal  (+) Arthritis ,   Abdominal   Peds negative pediatric ROS (+)  Hematology negative hematology ROS (+)   Anesthesia Other Findings   Reproductive/Obstetrics negative OB ROS                            Anesthesia Physical Anesthesia Plan  ASA: 2  Anesthesia Plan: General   Post-op Pain Management:    Induction: Intravenous  PONV Risk Score and Plan: 3 and Treatment may vary due to age or medical condition, Ondansetron and Dexamethasone  Airway Management Planned: LMA  Additional Equipment: None  Intra-op Plan:   Post-operative Plan: Extubation in OR  Informed Consent: I have reviewed the patients History and Physical, chart, labs and discussed the procedure including the risks, benefits and alternatives for the proposed anesthesia with the patient or authorized representative who has indicated his/her understanding and acceptance.     Dental advisory given  Plan Discussed with: CRNA, Anesthesiologist and Surgeon  Anesthesia Plan Comments:        Anesthesia Quick  Evaluation

## 2021-02-23 ENCOUNTER — Encounter (HOSPITAL_BASED_OUTPATIENT_CLINIC_OR_DEPARTMENT_OTHER): Payer: Self-pay | Admitting: Urology

## 2021-06-01 ENCOUNTER — Ambulatory Visit: Payer: Medicare Other | Admitting: Orthopaedic Surgery

## 2021-06-07 ENCOUNTER — Ambulatory Visit: Payer: Medicare Other | Admitting: Orthopaedic Surgery

## 2021-06-21 ENCOUNTER — Ambulatory Visit: Payer: Medicare Other | Admitting: Orthopaedic Surgery

## 2021-06-21 ENCOUNTER — Encounter: Payer: Self-pay | Admitting: Orthopaedic Surgery

## 2021-06-21 ENCOUNTER — Ambulatory Visit: Payer: Self-pay

## 2021-06-21 ENCOUNTER — Other Ambulatory Visit: Payer: Self-pay

## 2021-06-21 DIAGNOSIS — G8929 Other chronic pain: Secondary | ICD-10-CM

## 2021-06-21 DIAGNOSIS — M25512 Pain in left shoulder: Secondary | ICD-10-CM

## 2021-06-21 NOTE — Progress Notes (Signed)
Office Visit Note   Patient: Karina [name]           Date of Birth: April 07, 1941           MRN: [id number] Visit Date: 06/21/2021              Requested by: Karina Solian, MD [address] [place],  [place] [address] PCP: Karina Solian, MD   Assessment & Plan: Visit Diagnoses:  1. Chronic left shoulder pain     Plan: Karina [name] has been experiencing left shoulder pain for at least several weeks without history of injury or trauma.  She is having trouble raising her arm over her head and pain in the shoulder when lying on her left side.  She has had prior arthroscopic debridement by Karina [name] some years ago and has had a prior rotator cuff tear repair on the right side.  She also notes that she has a mass in the triceps muscle proximally that was evaluated by MRI scan several years ago and was consistent with a lipoma.  She is having a little bit more pain and she feels like it may have enlarged in size.  I am concerned that she may have a rotator cuff tear and would like to obtain an MRI scan and also reevaluate the mass in the triceps muscle only because it seems to enlarge and is somewhat uncomfortable  Follow-Up Instructions: No follow-ups on file.   Orders:  Orders Placed This Encounter  Procedures   XR Shoulder Left   No orders of the defined types were placed in this encounter.     Procedures: No procedures performed   Clinical Data: No additional findings.   Subjective: Chief Complaint  Patient presents with   Left Shoulder - Pain  Patient presents today for left shoulder pain. She said that it has been hurting for several weeks. No known injury. Her pain is all throughout her shoulder and down her arm. Decreased range of motion. Numbness in her left hand. She has a history of arthroscopic surgery with Karina [name] years ago. She takes tylenol for pain relief. She is right hand dominant.   HPI  Review of Systems   Objective: Vital Signs: There were no  vitals taken for this visit.  Physical Exam Constitutional:      Appearance: She is well-developed.  Pulmonary:     Effort: Pulmonary effort is normal.  Skin:    General: Skin is warm and dry.  Neurological:     Mental Status: She is alert and oriented to person, place, and time.  Psychiatric:        Behavior: Behavior normal.    Ortho Exam awake alert and oriented x3.  Comfortable sitting.  Minimal discomfort with range of motion of cervical spine.  Touching chin to chest did not create any pain.  Some posterior cervical discomfort with neck extension but no referred pain to either upper extremity.  Has just about normal rotation of the right and the left.  Some loss of full extension.  Minimally positive impingement on the extreme of internal and external rotation.  Some anterior and lateral subacromial discomfort.  No crepitation.  Appears to have good strength.  Prior distal radius internal fixation with functional range of motion of the wrist.  Good grip and release.  There is a soft tissue mass in the triceps muscle proximally measuring approximately 2 x 2 cm.  It is freely mobile and minimally uncomfortable.  This was evaluated by MRI scan  in 2019 and consistent with a lipoma  Specialty Comments:  No specialty comments available.  Imaging: No results found.   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   Primary osteoarthritis of right hip 03/11/2018   Past Medical History:  Diagnosis Date   Arthritis    Chronic bladder pain    Chronic low back pain    Diverticulosis    Family history of adverse reaction to anesthesia    sister-N/V   Full dentures    GERD (gastroesophageal reflux disease)    Hiatal hernia    History of malignant melanoma 1978   per pt resection right calf area, no lymph node removal, localized ,  had 3 rounds of chemo  completed 1978;  pt states no recurrence   History of panic attacks    HTN (hypertension)    followed by pcp----  (nuclear stress test in epic 11-29-2009 normal   Hypercholesterolemia    Hypothyroidism, postsurgical    followed by pcp---  s/p  left thryoid lobectomy 1990s, no cancer   IC (interstitial cystitis)    urologist--- dr Karina [name]   Osteoporosis    Wears glasses     History reviewed. No pertinent family history.  Past Surgical History:  Procedure Laterality Date   APPENDECTOMY  1957   CATARACT EXTRACTION W/ INTRAOCULAR LENS IMPLANT Bilateral 2020   CYSTOSCOPY WITH URETHRAL DILATATION N/A 02/22/2021   Procedure: CYSTOSCOPY WITH URETHRAL DILATATION HYDRODISTENTION OF BLADDER AND INSTILLATION OF MARICARE AND PYRIDIUM;  Surgeon: Karina Seal, MD;  Location: [facility];  Service: Urology;  Laterality: N/A;  [place] SURGERY  2014   approx---  (left forehead)   ORIF WRIST FRACTURE Left 2018   OVARIAN CYST REMOVAL     laparatomy x2  last one age 42   SHOULDER ARTHROSCOPY Left 2004   @[facility]    SHOULDER OPEN ROTATOR CUFF REPAIR Right 2006   THYROIDECTOMY, PARTIAL Left    1990s   TONSILLECTOMY     child   TOTAL ABDOMINAL HYSTERECTOMY W/ BILATERAL SALPINGOOPHORECTOMY  1971   PER PT TOLD REMNANT OF ONE OVARY RETAINED   TOTAL HIP ARTHROPLASTY Right 05/07/2018   Procedure: RIGHT TOTAL HIP ARTHROPLASTY;  Surgeon: Karina Balding, MD;  Location: [place] [facility];  Service: Orthopedics;  Laterality: Right;   Social History   Occupational History   Not on file  Tobacco Use   Smoking status: Former    Years: 35.00    Types: Cigarettes    Quit date: 01/2020    Years since quitting: 1.4   Smokeless tobacco: Never  Vaping Use   Vaping Use: Never used  Substance and Sexual Activity   Alcohol use: No   Drug use: Never   Sexual activity: Not on file

## 2021-06-28 ENCOUNTER — Telehealth: Payer: Self-pay | Admitting: Orthopaedic Surgery

## 2021-06-28 NOTE — Telephone Encounter (Signed)
Called no answer, left vm to call back and sch MRI left shoulder review with Dr. Durward Fortes after 07/06/21

## 2021-07-06 ENCOUNTER — Other Ambulatory Visit: Payer: Self-pay

## 2021-07-06 ENCOUNTER — Ambulatory Visit
Admission: RE | Admit: 2021-07-06 | Discharge: 2021-07-06 | Disposition: A | Discharge status: Home / Self Care | Payer: Medicare Other | Source: Ambulatory Visit | Attending: Orthopaedic Surgery | Admitting: Orthopaedic Surgery

## 2021-07-06 DIAGNOSIS — G8929 Other chronic pain: Secondary | ICD-10-CM

## 2021-07-12 ENCOUNTER — Other Ambulatory Visit: Payer: Self-pay

## 2021-07-12 ENCOUNTER — Encounter: Payer: Self-pay | Admitting: Orthopaedic Surgery

## 2021-07-12 ENCOUNTER — Ambulatory Visit: Payer: Medicare Other | Admitting: Orthopaedic Surgery

## 2021-07-12 DIAGNOSIS — G8929 Other chronic pain: Secondary | ICD-10-CM | POA: Diagnosis not present

## 2021-07-12 DIAGNOSIS — M25512 Pain in left shoulder: Secondary | ICD-10-CM

## 2021-07-12 NOTE — Progress Notes (Signed)
Office Visit Note   Patient: Karina [name]           Date of Birth: Jul 27, 1940           MRN: [id number] Visit Date: 07/12/2021              Requested by: Prince Solian, MD [address] [place],  [place] [address] PCP: Prince Solian, MD   Assessment & Plan: Visit Diagnoses:  1. Chronic left shoulder pain     Plan: Karina [name] had an MRI scan of her left shoulder demonstrating a focal high-grade tear involving the mid to posterior aspect of the supraspinatus tendon which appeared to be essentially a full-thickness tear without retraction.  Measured about 4 mm in the AP dimension.  There was high-grade partial-thickness articular surface tear of the anterior infraspinatus.  The subscap tendon was attenuated but otherwise intact.  Teres minor was unremarkable.  The subscapularis muscle is atrophic and the remaining rotator cuff demonstrated preserved bulk and signal intensity.  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  Labrum was grossly intact.  Mild arthropathy of the [facility] joint and a small subacromial subdeltoid bursal fluid.  Very small humeral head marginal osteophytes consistent with osteoarthritis.  Patient also has a lipoma of the triceps proximally and this was partially evaluated.  Long discussion with Karina [name] regarding the above.  I think the best approach would be an arthroscopic SCD, DCR and mini open rotator cuff tear repair.  I think the majority of her symptoms originate from the rotator cuff tear.  She like to proceed.  I discussed the surgery and potential complications and will plan the surgery sometime in January.  Lots of questions about what she can expect postoperatively which we discussed as well.  Should be in a sling for about 6 weeks and will need some postop therapy.  She does have some mild arthritis of the glenohumeral joint which might slow her postoperative course.  Follow-Up Instructions: Return We will schedule left shoulder rotator cuff tear  repair.   Orders:  No orders of the defined types were placed in this encounter.  No orders of the defined types were placed in this encounter.     Procedures: No procedures performed   Clinical Data: No additional findings.   Subjective: Chief Complaint  Patient presents with   Left Shoulder - Follow-up    MRI review  Patient presents today for follow up on her left shoulder. She had an MRI and is here today for those results.  No change in symptoms  HPI  Review of Systems   Objective: Vital Signs: There were no vitals taken for this visit.  Physical Exam Constitutional:      Appearance: She is well-developed.  Pulmonary:     Effort: Pulmonary effort is normal.  Skin:    General: Skin is warm and dry.  Neurological:     Mental Status: She is alert and oriented to person, place, and time.  Psychiatric:        Behavior: Behavior normal.    Ortho Exam left shoulder with full overhead motion and with a circuitous arc.  Good strength.  Pain along the anterior lateral subacromial region and minimal pain at the [facility] joint.  Abduction to least 90 degrees's with some impingement.  Positive empty can testing.  Speeds sign was mildly positive.  Difficult to assess position of the biceps muscle but it appeared to be normal despite the MRI scan demonstrating  probable rupture.  No ecchymosis.  No loss of external rotation or internal rotation compared to the right shoulder.  No crepitation  Specialty Comments:  No specialty comments available.  Imaging: No results found.   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   Primary osteoarthritis of right hip 03/11/2018   Past Medical History:  Diagnosis Date   Arthritis    Chronic bladder pain    Chronic low back pain    Diverticulosis    Family history of adverse reaction to anesthesia     sister-N/V   Full dentures    GERD (gastroesophageal reflux disease)    Hiatal hernia    History of malignant melanoma 1978   per pt resection right calf area, no lymph node removal, localized ,  had 3 rounds of chemo completed 1978;  pt states no recurrence   History of panic attacks    HTN (hypertension)    followed by pcp----  (nuclear stress test in epic 11-29-2009 normal   Hypercholesterolemia    Hypothyroidism, postsurgical    followed by pcp---  s/p  left thryoid lobectomy 1990s, no cancer   IC (interstitial cystitis)    urologist--- dr Jeffie Pollock   Osteoporosis    Wears glasses     History reviewed. No pertinent family history.  Past Surgical History:  Procedure Laterality Date   APPENDECTOMY  1957   CATARACT EXTRACTION W/ INTRAOCULAR LENS IMPLANT Bilateral 2020   CYSTOSCOPY WITH URETHRAL DILATATION N/A 02/22/2021   Procedure: CYSTOSCOPY WITH URETHRAL DILATATION HYDRODISTENTION OF BLADDER AND INSTILLATION OF MARICARE AND PYRIDIUM;  Surgeon: Irine Seal, MD;  Location: [facility];  Service: Urology;  Laterality: N/A;  [place] SURGERY  2014   approx---  (left forehead)   ORIF WRIST FRACTURE Left 2018   OVARIAN CYST REMOVAL     laparatomy x2  last one age 42   SHOULDER ARTHROSCOPY Left 2004   @[facility]    SHOULDER OPEN ROTATOR CUFF REPAIR Right 2006   THYROIDECTOMY, PARTIAL Left    1990s   TONSILLECTOMY     child   TOTAL ABDOMINAL HYSTERECTOMY W/ BILATERAL SALPINGOOPHORECTOMY  1971   PER PT TOLD REMNANT OF ONE OVARY RETAINED   TOTAL HIP ARTHROPLASTY Right 05/07/2018   Procedure: RIGHT TOTAL HIP ARTHROPLASTY;  Surgeon: Garald Balding, MD;  Location: [place];  Service: Orthopedics;  Laterality: Right;   Social History   Occupational History   Not on file  Tobacco Use   Smoking status: Former    Years: 35.00    Types: Cigarettes    Quit date: 01/2020    Years since quitting: 1.4   Smokeless tobacco: Never  Vaping Use   Vaping Use: Never  used  Substance and Sexual Activity   Alcohol use: No   Drug use: Never   Sexual activity: Not on file

## 2021-08-04 ENCOUNTER — Other Ambulatory Visit: Payer: Self-pay | Admitting: Orthopaedic Surgery

## 2021-08-04 DIAGNOSIS — M19012 Primary osteoarthritis, left shoulder: Secondary | ICD-10-CM | POA: Diagnosis not present

## 2021-08-04 DIAGNOSIS — M75122 Complete rotator cuff tear or rupture of left shoulder, not specified as traumatic: Secondary | ICD-10-CM | POA: Diagnosis not present

## 2021-08-04 DIAGNOSIS — M94212 Chondromalacia, left shoulder: Secondary | ICD-10-CM | POA: Diagnosis not present

## 2021-08-04 DIAGNOSIS — M7542 Impingement syndrome of left shoulder: Secondary | ICD-10-CM | POA: Diagnosis not present

## 2021-08-04 MED ORDER — HYDROCODONE-ACETAMINOPHEN 5-325 MG PO TABS: 1.0000 | ORAL_TABLET | ORAL | 0 refills | Status: DC | PRN

## 2021-08-11 ENCOUNTER — Ambulatory Visit (INDEPENDENT_AMBULATORY_CARE_PROVIDER_SITE_OTHER): Payer: Medicare Other | Admitting: Orthopaedic Surgery

## 2021-08-11 ENCOUNTER — Encounter: Payer: Medicare Other | Admitting: Orthopaedic Surgery

## 2021-08-11 ENCOUNTER — Encounter: Payer: Self-pay | Admitting: Orthopaedic Surgery

## 2021-08-11 ENCOUNTER — Other Ambulatory Visit: Payer: Self-pay

## 2021-08-11 DIAGNOSIS — G8929 Other chronic pain: Secondary | ICD-10-CM

## 2021-08-11 DIAGNOSIS — M25512 Pain in left shoulder: Secondary | ICD-10-CM

## 2021-08-11 NOTE — Progress Notes (Signed)
Office Visit Note   Patient: Karina [name]           Date of Birth: 30-Apr-1941           MRN: [id number] Visit Date: 08/11/2021              Requested by: Prince Solian, MD [address] [place],   [address] PCP: Prince Solian, MD   Assessment & Plan: Visit Diagnoses:  1. Chronic left shoulder pain     Plan: Karina [name] is 1 week status post left shoulder arthroscopic SCD, DCR and mini open rotator cuff tear repair.  The biceps tendon had been ruptured prior to the surgery.  Doing well.  Wounds are healing nicely.  Dressing was removed.  Steri-Strips were not necessary.  We will have her continue wearing the sling when she is out and about but otherwise start circumduction exercises.  We will have her start physical therapy at [facility] [place] and check her back in 3 weeks  Follow-Up Instructions: Return in about 3 weeks (around 09/01/2021).   Orders:  Orders Placed This Encounter  Procedures   Ambulatory referral to Physical Therapy   No orders of the defined types were placed in this encounter.     Procedures: No procedures performed   Clinical Data: No additional findings.   Subjective: Chief Complaint  Patient presents with   Left Shoulder - Follow-up    Left shoulder arthroscopy 08/04/21  Patient presents today for follow up on her left shoulder. She is now a week out from left shoulder arthroscopy. Her surgery was on 08/04/2021. She said that she is doing well. She is taking hydrocodone for pain relief.  HPI  Review of Systems   Objective: Vital Signs: There were no vitals taken for this visit.  Physical Exam  Ortho Exam arthroscopic portals and mini open rotator cuff tear repair incision healing without problem.  No drainage or evidence of infection.  Resolving ecchymosis.  Good grip and release  Specialty Comments:  No specialty comments available.  Imaging: No results found.   PMFS History: Patient Active Problem List    Diagnosis Date Noted   Pain in left shoulder 06/21/2021   Low back pain 01/28/2020   Status post right hip replacement 05/07/2018   Primary osteoarthritis of right hip 03/11/2018   Past Medical History:  Diagnosis Date   Arthritis    Chronic bladder pain    Chronic low back pain    Diverticulosis    Family history of adverse reaction to anesthesia    sister-N/V   Full dentures    GERD (gastroesophageal reflux disease)    Hiatal hernia    History of malignant melanoma 1978   per pt resection right calf area, no lymph node removal, localized ,  had 3 rounds of chemo completed 1978;  pt states no recurrence   History of panic attacks    HTN (hypertension)    followed by pcp----  (nuclear stress test in epic 11-29-2009 normal   Hypercholesterolemia    Hypothyroidism, postsurgical    followed by pcp---  s/p  left thryoid lobectomy 1990s, no cancer   IC (interstitial cystitis)    urologist--- dr Jeffie Pollock   Osteoporosis    Wears glasses     History reviewed. No pertinent family history.  Past Surgical History:  Procedure Laterality Date   APPENDECTOMY  1957   CATARACT EXTRACTION W/ INTRAOCULAR LENS IMPLANT Bilateral 2020   CYSTOSCOPY WITH URETHRAL DILATATION N/A  02/22/2021   Procedure: CYSTOSCOPY WITH URETHRAL DILATATION HYDRODISTENTION OF BLADDER AND INSTILLATION OF MARICARE AND PYRIDIUM;  Surgeon: Irine Seal, MD;  Location: [facility];  Service: Urology;  Laterality: N/A;  [place] SURGERY  2014   approx---  (left forehead)   ORIF WRIST FRACTURE Left 2018   OVARIAN CYST REMOVAL     laparatomy x2  last one age 5   SHOULDER ARTHROSCOPY Left 2004   @[facility]    SHOULDER OPEN ROTATOR CUFF REPAIR Right 2006   THYROIDECTOMY, PARTIAL Left    1990s   TONSILLECTOMY     child   TOTAL ABDOMINAL HYSTERECTOMY W/ BILATERAL SALPINGOOPHORECTOMY  1971   PER PT TOLD REMNANT OF ONE OVARY RETAINED   TOTAL HIP ARTHROPLASTY Right 05/07/2018   Procedure: RIGHT TOTAL  HIP ARTHROPLASTY;  Surgeon: Garald Balding, MD;  Location: [place];  Service: Orthopedics;  Laterality: Right;   Social History   Occupational History   Not on file  Tobacco Use   Smoking status: Former    Years: 35.00    Types: Cigarettes    Quit date: 01/2020    Years since quitting: 1.5   Smokeless tobacco: Never  Vaping Use   Vaping Use: Never used  Substance and Sexual Activity   Alcohol use: No   Drug use: Never   Sexual activity: Not on file

## 2021-09-01 ENCOUNTER — Other Ambulatory Visit: Payer: Self-pay

## 2021-09-01 ENCOUNTER — Encounter: Payer: Self-pay | Admitting: Orthopaedic Surgery

## 2021-09-01 ENCOUNTER — Ambulatory Visit: Payer: Medicare Other | Admitting: Orthopaedic Surgery

## 2021-09-01 DIAGNOSIS — G8929 Other chronic pain: Secondary | ICD-10-CM

## 2021-09-01 DIAGNOSIS — M25512 Pain in left shoulder: Secondary | ICD-10-CM

## 2021-09-01 NOTE — Progress Notes (Signed)
Office Visit Note   Patient: Karina [name]           Date of Birth: [birth year]           MRN: [id number] Visit Date: 09/01/2021              Requested by: Prince Solian, MD [address] [place],  [place] [address] PCP: Prince Solian, MD   Assessment & Plan: Visit Diagnoses:  1. Chronic left shoulder pain     Plan: 1 month status post left shoulder arthroscopic SCD and DCR.  Did a mini open rotator cuff tear repair.  There several longitudinal tears of the supraspinatus that was sewn without use of an anchor.  She is doing very well and feels like she is made good progress and not having the pain she had preoperatively.  She continues to go to physical therapy and feels like it is made a big difference.  She is now is able to get her arm over her head without much trouble.  We will have her continue with therapy and her exercises and check her back in a month  Follow-Up Instructions: Return in about 1 month (around 09/29/2021).   Orders:  No orders of the defined types were placed in this encounter.  No orders of the defined types were placed in this encounter.     Procedures: No procedures performed   Clinical Data: No additional findings.   Subjective: Chief Complaint  Patient presents with   Left Shoulder - Follow-up    Left shoulder arthroscopy 08/04/2021  Patient presents today for a three week follow up on her left shoulder. She had a left shoulder arthroscopy on 08/04/2021. She is now a month out from surgery. She has been going to physical therapy twice weekly. She said that she has noticed a lot of improvement.   HPI  Review of Systems   Objective: Vital Signs: There were no vitals taken for this visit.  Physical Exam  Ortho Exam left shoulder incisions of healed without problem.  Good grip and release.  Able to place her arm fully overhead actively.  There is a least 90 degrees of abduction.  No evidence of infection.  Specialty Comments:  No  specialty comments available.  Imaging: No results found.   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   Primary osteoarthritis of right hip 03/11/2018   Past Medical History:  Diagnosis Date   Arthritis    Chronic bladder pain    Chronic low back pain    Diverticulosis    Family history of adverse reaction to anesthesia    sister-N/V   Full dentures    GERD (gastroesophageal reflux disease)    Hiatal hernia    History of malignant melanoma 1978   per pt resection right calf area, no lymph node removal, localized ,  had 3 rounds of chemo completed 1978;  pt states no recurrence   History of panic attacks    HTN (hypertension)    followed by pcp----  (nuclear stress test in epic 11-29-2009 normal   Hypercholesterolemia    Hypothyroidism, postsurgical    followed by pcp---  s/p  left thryoid lobectomy 1990s, no cancer   IC (interstitial cystitis)    urologist--- dr Jeffie Pollock   Osteoporosis    Wears glasses     History reviewed. No  pertinent family history.  Past Surgical History:  Procedure Laterality Date   APPENDECTOMY  1957   CATARACT EXTRACTION W/ INTRAOCULAR LENS IMPLANT Bilateral 2020   CYSTOSCOPY WITH URETHRAL DILATATION N/A 02/22/2021   Procedure: CYSTOSCOPY WITH URETHRAL DILATATION HYDRODISTENTION OF BLADDER AND INSTILLATION OF MARICARE AND PYRIDIUM;  Surgeon: Irine Seal, MD;  Location: [facility];  Service: Urology;  Laterality: N/A;  [place] SURGERY  2014   approx---  (left forehead)   ORIF WRIST FRACTURE Left 2018   OVARIAN CYST REMOVAL     laparatomy x2  last one age 69   SHOULDER ARTHROSCOPY Left 2004   @[facility]    SHOULDER OPEN ROTATOR CUFF REPAIR Right 2006   THYROIDECTOMY, PARTIAL Left    1990s   TONSILLECTOMY     child   TOTAL ABDOMINAL HYSTERECTOMY W/ BILATERAL SALPINGOOPHORECTOMY  1971    PER PT TOLD REMNANT OF ONE OVARY RETAINED   TOTAL HIP ARTHROPLASTY Right 05/07/2018   Procedure: RIGHT TOTAL HIP ARTHROPLASTY;  Surgeon: Garald Balding, MD;  Location: [facility];  Service: Orthopedics;  Laterality: Right;   Social History   Occupational History   Not on file  Tobacco Use   Smoking status: Former    Years: 35.00    Types: Cigarettes    Quit date: 01/2020    Years since quitting: 1.6   Smokeless tobacco: Never  Vaping Use   Vaping Use: Never used  Substance and Sexual Activity   Alcohol use: No   Drug use: Never   Sexual activity: Not on file

## 2021-09-16 ENCOUNTER — Other Ambulatory Visit: Payer: Self-pay

## 2021-10-06 ENCOUNTER — Encounter: Payer: Self-pay | Admitting: Orthopaedic Surgery

## 2021-10-06 ENCOUNTER — Ambulatory Visit: Payer: Medicare Other | Admitting: Orthopaedic Surgery

## 2021-10-06 ENCOUNTER — Other Ambulatory Visit: Payer: Self-pay

## 2021-10-06 DIAGNOSIS — M25512 Pain in left shoulder: Secondary | ICD-10-CM

## 2021-10-06 DIAGNOSIS — G8929 Other chronic pain: Secondary | ICD-10-CM

## 2021-10-06 NOTE — Progress Notes (Addendum)
? ?Office Visit Note ?  ?Patient: Karina [name]           ?Date of Birth: 03/07/1941           ?MRN: [id number] ?Visit Date: 10/06/2021 ?             ?Requested by: Prince Solian, MD ?[address] ?[place],  [place] [address] ?PCP: Prince Solian, MD ? ? ?Assessment & Plan: ?Visit Diagnoses: Left shoulder pain ?Plan: Patient is 2 months status post left shoulder arthroscopy subacromial decompression distal clavicle excision and mini rotator cuff repair.  She is doing well.  She has completed physical therapy.  She has no complaints. ? ?Follow-Up Instructions: No follow-ups on file.  ? ?Orders:  ?No orders of the defined types were placed in this encounter. ? ?No orders of the defined types were placed in this encounter. ? ? ? ? Procedures: ?No procedures performed ? ? ?Clinical Data: ?No additional findings. ? ? ?Subjective: ?No chief complaint on file. ?Patient presents today for follow up on her left shoulder. She had a left shoulder arthroscopy on 08/04/21. She has finished physical therapy. She takes Tylenol as needed for pain relief. She is doing well.  ? ?HPI ? ?Review of Systems  ?All other systems reviewed and are negative. ? ? ?Objective: ?Vital Signs: There were no vitals taken for this visit. ? ?Physical Exam ?Constitutional:   ?   Appearance: Normal appearance.  ?Neurological:  ?   Mental Status: She is alert.  ? ? ?Ortho Exam ?Patient is pleasant comfortable to exam.  She has forward elevation to 175 degrees passively to 180 degrees.  Well-healed surgical incision no redness no erythema.  She has excellent abductor strength external and internal rotation.  She can easily internally rotate behind her back ?Specialty Comments:  ?No specialty comments available. ? ?Imaging: ?No results found. ? ? ?PMFS History: ?Patient Active Problem List  ? Diagnosis Date Noted  ? Pain in left shoulder 06/21/2021  ? Low back pain 01/28/2020  ? 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 Primary osteoarthritis of  right hip 03/11/2018  ? ?Past Medical History:  ?Diagnosis Date  ? Arthritis   ? Chronic bladder pain   ? Chronic low back pain   ? Diverticulosis   ? Family history of adverse reaction to anesthesia   ? sister-N/V  ? Full dentures   ? GERD (gastroesophageal reflux disease)   ? Hiatal hernia   ? History of malignant melanoma 1978  ? per pt resection right calf area, no lymph node removal, localized ,  had 3 rounds of chemo completed 1978;  pt states no recurrence  ? History of panic attacks   ? HTN (hypertension)   ? followed by pcp----  (nuclear stress test in epic 11-29-2009 normal  ? Hypercholesterolemia   ? Hypothyroidism, postsurgical   ? followed by pcp---  s/p  left thryoid lobectomy 1990s, no cancer  ? IC (interstitial cystitis)   ? urologist--- dr Jeffie Pollock  ? Osteoporosis   ? Wears glasses   ?  ?No family history on file.  ?Past Surgical History:  ?Procedure Laterality Date  ? APPENDECTOMY  1957  ? CATARACT EXTRACTION W/ INTRAOCULAR LENS IMPLANT Bilateral 2020  ? CYSTOSCOPY WITH URETHRAL DILATATION N/A 02/22/2021  ? Procedure: CYSTOSCOPY WITH URETHRAL DILATATION HYDRODISTENTION OF BLADDER AND INSTILLATION OF MARICARE AND PYRIDIUM;  Surgeon: Irine Seal, MD;  Location: [facility];  Service: Urology;  Laterality: N/A;  30 MINS FOR CASE  ?  [facility] SURGERY  2014  ? approx---  (left forehead)  ? ORIF WRIST FRACTURE Left 2018  ? OVARIAN CYST REMOVAL    ? laparatomy x2  last one age 3  ? SHOULDER ARTHROSCOPY Left 2004  ? '@[facility]'$   ? SHOULDER OPEN ROTATOR CUFF REPAIR Right 2006  ? THYROIDECTOMY, PARTIAL Left   ? 1990s  ? TONSILLECTOMY    ? child  ? TOTAL ABDOMINAL HYSTERECTOMY W/ BILATERAL SALPINGOOPHORECTOMY  1971  ? PER PT TOLD REMNANT OF ONE OVARY RETAINED  ? TOTAL HIP ARTHROPLASTY Right 05/07/2018  ? Procedure: RIGHT TOTAL HIP ARTHROPLASTY;  Surgeon: Garald Balding, MD;  Location: [facility];  Service: Orthopedics;  Laterality: Right;  ? ?Social History  ? ?Occupational History  ? Not on file  ?Tobacco Use   ? Smoking status: Former  ?  Years: 35.00  ?  Types: Cigarettes  ?  Quit date: 01/2020  ?  Years since quitting: 1.7  ? Smokeless tobacco: Never  ?Vaping Use  ? Vaping Use: Never used  ?Substance and Sexual Activity  ? Alcohol use: No  ? Drug use: Never  ? Sexual activity: Not on file  ? ? ? ? ? ? ?

## 2022-02-28 ENCOUNTER — Other Ambulatory Visit: Payer: Self-pay | Admitting: Internal Medicine

## 2022-02-28 DIAGNOSIS — Z720 Tobacco use: Secondary | ICD-10-CM

## 2022-03-03 ENCOUNTER — Other Ambulatory Visit: Payer: Self-pay | Admitting: Internal Medicine

## 2022-03-03 DIAGNOSIS — R1011 Right upper quadrant pain: Secondary | ICD-10-CM

## 2022-03-13 ENCOUNTER — Other Ambulatory Visit: Payer: Medicare Other

## 2022-03-13 ENCOUNTER — Ambulatory Visit
Admission: RE | Admit: 2022-03-13 | Discharge: 2022-03-13 | Disposition: A | Discharge status: Home / Self Care | Payer: Medicare Other | Source: Ambulatory Visit | Attending: Internal Medicine | Admitting: Internal Medicine

## 2022-03-13 DIAGNOSIS — R1011 Right upper quadrant pain: Secondary | ICD-10-CM

## 2022-03-24 ENCOUNTER — Ambulatory Visit
Admission: RE | Admit: 2022-03-24 | Discharge: 2022-03-24 | Disposition: A | Discharge status: Home / Self Care | Payer: Medicare Other | Source: Ambulatory Visit | Attending: Internal Medicine | Admitting: Internal Medicine

## 2022-03-24 DIAGNOSIS — Z720 Tobacco use: Secondary | ICD-10-CM

## 2022-03-31 ENCOUNTER — Ambulatory Visit: Payer: Medicare Other | Admitting: Cardiovascular Disease

## 2022-04-28 ENCOUNTER — Ambulatory Visit: Payer: Medicare Other | Admitting: Cardiovascular Disease

## 2022-05-02 ENCOUNTER — Ambulatory Visit: Payer: Medicare Other | Attending: Cardiovascular Disease | Admitting: Cardiovascular Disease

## 2022-05-02 ENCOUNTER — Encounter: Payer: Self-pay | Admitting: Cardiovascular Disease

## 2022-05-02 ENCOUNTER — Ambulatory Visit (INDEPENDENT_AMBULATORY_CARE_PROVIDER_SITE_OTHER): Payer: Medicare Other

## 2022-05-02 VITALS — BP 122/64 | HR 61 | Ht 60.0 in | Wt 127.4 lb

## 2022-05-02 DIAGNOSIS — R002 Palpitations: Secondary | ICD-10-CM

## 2022-05-02 DIAGNOSIS — R072 Precordial pain: Secondary | ICD-10-CM

## 2022-05-02 DIAGNOSIS — R0602 Shortness of breath: Secondary | ICD-10-CM | POA: Diagnosis not present

## 2022-05-02 NOTE — Progress Notes (Unsigned)
Enrolled for [employer] to mail a ZIO XT long term holter monitor to the patients address on file.  

## 2022-05-02 NOTE — [place] Notes (Signed)
Cardiology Office Note:    Date:  05/02/2022   ID:  Verdis Frederickson, DOB 12-10-1940, MRN [id number]  PCP:  Prince Solian, MD   [place] [facility] Cardiologist:  Sanda Klein, MD     Referring MD: Prince Solian, MD   Chief Complaint  Patient presents with   Consult  Karina [name] is a 81 y.o. female who is being seen today for the evaluation of palpitations and shortness of breath at the request of Avva, Ravisankar, MD.   History of Present Illness:    Karina [name] is a 81 y.o. female with a hx of hypertension, hypercholesterolemia, postsurgical hypothyroidism, GERD, irritable bowel syndrome, osteoporosis, interstitial cystitis ongoing light smoker referred for palpitations and a subjective complaint of shortness of breath.  Palpitations occur without obvious trigger both during daytime and more frequently at night.  She describes it as a feeling of a "racing heartbeat" that sometimes even wakes her at night.  The spells never last more than about a minute.  Sometimes the rhythm appears regular, sometimes more erratic.  This worries her because she has family members that have required pacemaker therapy.  She has not had syncope or presyncope.  She developed shortness of breath with light activity such as walking to the mailbox or walking her Dachsund.  She denies intermittent claudication or lower extremity edema.  She frequently has discomfort in the left side of her chest, but this is not associated with physical activity.  It occurs randomly.  She describes it as being a pressure deep inside her left breast.  She has a history of statin myopathy and takes Repatha for roughly the last year. She lives alone, but is close to her younger sister Karina [name] who is also my patient..  Continues to smoke 2-3 cigarettes a day.  She smoked roughly a pack a day for close to 50 years.  Chest CT performed last month shows some scattered atherosclerotic calcifications in the coronaries  and the aorta, but does not show any focal pulmonary consolidation or clear evidence of lung disease.  Most recent lipid profile from July shows an LDL of 46 and HDL of 76.  She has normal blood pressure.  She does not have diabetes mellitus.  Past Medical History:  Diagnosis Date   Arthritis    Chronic bladder pain    Chronic low back pain    Diverticulosis    Family history of adverse reaction to anesthesia    sister-N/V   Full dentures    GERD (gastroesophageal reflux disease)    Hiatal hernia    History of malignant melanoma 1978   per pt resection right calf area, no lymph node removal, localized ,  had 3 rounds of chemo completed 1978;  pt states no recurrence   History of panic attacks    HTN (hypertension)    followed by pcp----  (nuclear stress test in epic 11-29-2009 normal   Hypercholesterolemia    Hypothyroidism, postsurgical    followed by pcp---  s/p  left thryoid lobectomy 1990s, no cancer   IC (interstitial cystitis)    urologist--- dr Jeffie Pollock   Osteoporosis    Wears glasses     Past Surgical History:  Procedure Laterality Date   APPENDECTOMY  1957   CATARACT EXTRACTION W/ INTRAOCULAR LENS IMPLANT Bilateral 2020   CYSTOSCOPY WITH URETHRAL DILATATION N/A 02/22/2021   Procedure: CYSTOSCOPY WITH URETHRAL DILATATION HYDRODISTENTION OF BLADDER AND INSTILLATION OF MARICARE AND PYRIDIUM;  Surgeon: Irine Seal, MD;  Location: [facility];  Service: Urology;  Laterality: N/A;  [place] SURGERY  2014   approx---  (left forehead)   ORIF WRIST FRACTURE Left 2018   OVARIAN CYST REMOVAL     laparatomy x2  last one age 42   SHOULDER ARTHROSCOPY Left 2004   '@[facility]'$    SHOULDER OPEN ROTATOR CUFF REPAIR Right 2006   THYROIDECTOMY, PARTIAL Left    1990s   TONSILLECTOMY     child   TOTAL ABDOMINAL HYSTERECTOMY W/ BILATERAL SALPINGOOPHORECTOMY  1971   PER PT TOLD REMNANT OF ONE OVARY RETAINED   TOTAL HIP ARTHROPLASTY Right 05/07/2018   Procedure:  RIGHT TOTAL HIP ARTHROPLASTY;  Surgeon: Garald Balding, MD;  Location: [facility];  Service: Orthopedics;  Laterality: Right;    Current Medications: Current Meds  Medication Sig   acetaminophen (TYLENOL) 500 MG tablet Take 500 mg by mouth every 6 (six) hours as needed.   ALPRAZolam (XANAX) 0.5 MG tablet Take 0.5 mg by mouth 2 (two) times daily.   aspirin 81 MG chewable tablet Chew 1 tablet (81 mg total) by mouth 2 (two) times daily. (Patient taking differently: Chew 81 mg by mouth daily.)   atenolol (TENORMIN) 50 MG tablet Take 50 mg by mouth daily.   augmented betamethasone dipropionate (DIPROLENE-AF) 0.05 % cream Apply topically 2 (two) times daily.   calcium carbonate (OS-CAL) 600 MG TABS tablet Take 600 mg by mouth daily.   celecoxib (CELEBREX) 100 MG capsule Take 100 mg by mouth 2 (two) times daily as needed.   Coenzyme Q10 (CO Q 10) 100 MG CAPS Take 100 mg by mouth daily.   ergocalciferol (VITAMIN D2) 50000 units capsule Take 50,000 Units by mouth 2 (two) times a week.   levothyroxine (SYNTHROID) 88 MCG tablet Take 88 mcg by mouth daily before breakfast.   Meth-Hyo-M Bl-Na Phos-Ph Sal (URIBEL) 118 MG CAPS Take 118 mg by mouth daily as needed (for bladder).    olmesartan-hydrochlorothiazide (BENICAR HCT) 20-12.5 MG tablet Take 1 tablet by mouth daily.   omeprazole (PRILOSEC) 20 MG capsule Take 20 mg by mouth daily. Per pt and takes if needed in the evening   REPATHA SURECLICK 086 MG/ML SOAJ Inject 1 mL into the skin every 14 (fourteen) days.   risedronate (ACTONEL) 150 MG tablet Take 150 mg by mouth every 30 (thirty) days.   sertraline (ZOLOFT) 100 MG tablet Take 100 mg by mouth every morning.   Vitamin E 200 units TABS Take 200 Units by mouth daily.   ZETIA 10 MG tablet Take 10 mg by mouth at bedtime.      Allergies:   Morphine and Propoxyphene hcl   Social History   Socioeconomic History   Marital status: Single    Spouse name: Not on file   Number of children: Not on file    Years of education: Not on file   Highest education level: Not on file  Occupational History   Not on file  Tobacco Use   Smoking status: Former    Years: 35.00    Types: Cigarettes    Quit date: 01/2020    Years since quitting: 2.2   Smokeless tobacco: Never  Vaping Use   Vaping Use: Never used  Substance and Sexual Activity   Alcohol use: No   Drug use: Never   Sexual activity: Not on file  Other Topics Concern   Not on file  Social History Narrative   Not on file   Social  Determinants of Health   Financial Resource Strain: Not on file  Food Insecurity: Not on file  Transportation Needs: Not on file  Physical Activity: Not on file  Stress: Not on file  Social Connections: Not on file     Family History: The patient's 3 significant for mitral valve prolapse and left bundle branch block in her mother, syncope and pacemaker implantation in her older sister.  ROS:   Please see the history of present illness.     All other systems reviewed and are negative.  EKGs/Labs/Other Studies Reviewed:    The following studies were reviewed today: Notes and labs from primary care provider  CT chest with contrast 03/24/2022  Cardiovascular: There are scattered coronary artery calcifications. Scattered calcifications are seen in thoracic aorta and its major branches.   Mediastinum/Nodes: No significant lymphadenopathy is seen. Thyroid is smaller than usual in size. There is inhomogeneous attenuation in right lobe of thyroid with 6 mm low-density nodule.   Lungs/Pleura: There is no focal pulmonary consolidation. There are no discrete lung nodules. There is mild ectasia and bronchi in the lower lobes. In image 81 of series 5, there is a small linear density in the lateral aspect of right lower lung field suggesting minimal scarring in right middle lobe. Small linear density seen posterior right lower lung field may suggest scarring or subsegmental atelectasis.   Upper  Abdomen: Small hiatal hernia is seen. There is low-density in submucosal region in the visualized portions of colon.   Musculoskeletal: Degenerative changes are noted in thoracic spine.   IMPRESSION: There is no focal pulmonary consolidation or discrete lung nodules. There is no significant lymphadenopathy.   Small scattered coronary artery calcifications are seen. Small hiatal hernia.   There is decreased density in submucosal region in the visualized portions of hepatic and splenic flexures in colon. This may be an artifact due to incomplete distention or suggest chronic nonspecific colitis.   Other findings as described in the body of the report.    EKG:  EKG is ordered today.  The ekg ordered today demonstrates normal sinus rhythm, nonspecific repolarization changes with T wave inversions in leads III, aVF, V3-V4, normal QTc 432 ms  Recent Labs: No results found for requested labs within last 365 days.  02/17/2022 Glucose 82, normal liver function test, creatinine 0.7, potassium 3.6, hemoglobin 14.1, TSH 3.54, free T41.2 Recent Lipid Panel No results found for: "CHOL", "TRIG", "HDL", "CHOLHDL", "VLDL", "LDLCALC", "LDLDIRECT" 02/17/2022 Cholesterol 148, triglycerides 130, HDL 76, LDL 46  Risk Assessment/Calculations:                Physical Exam:    VS:  BP 122/64 (BP Location: Left Arm, Patient Position: Sitting, Cuff Size: Normal)   Pulse 61   Ht 5' (1.524 m)   Wt 127 lb 6.4 oz (57.8 kg)   SpO2 93%   BMI 24.88 kg/m     Wt Readings from Last 3 Encounters:  05/02/22 127 lb 6.4 oz (57.8 kg)  02/22/21 126 lb 3.2 oz (57.2 kg)  11/25/20 128 lb 15.5 oz (58.5 kg)     GEN:  Well nourished, well developed in no acute distress HEENT: Normal NECK: No JVD; No carotid bruits LYMPHATICS: No lymphadenopathy CARDIAC: RRR, no murmurs, rubs, gallops RESPIRATORY: I do not hear any wheezes, but she has bilateral dry crackles that do not clear with cough. ABDOMEN: Soft,  non-tender, non-distended MUSCULOSKELETAL:  No edema; No deformity  SKIN: Warm and dry NEUROLOGIC:  Alert and oriented  x 3 PSYCHIATRIC:  Normal affect   ASSESSMENT:    1. Precordial pain   2. Shortness of breath   3. Palpitations    PLAN:    In order of problems listed above:  Exertional dyspnea: My suspicion is that she has a pulmonary cause for her exertional dyspnea, but in view of her numerous risk factors is reasonable to look for cardiac etiology as well.  We will check an echocardiogram and CT coronary angiogram. Chest pain: Atypical, occurring at rest and not increased in physical activity.  However she does coronary risk factors including a long history of heavy smoking and advanced age.  Does have evidence of atherosclerotic calcifications in the aorta and coronaries on nondedicated chest CT.  We will schedule for coronary CT angiography. Palpitations: Suspect she is describing isolated PACs or PVCs, but sometimes has sustained racing heartbeat.  Would like her to wear a monitor to make sure were not dealing with episodes of paroxysmal atrial fibrillation. Smoking: Strongly recommend complete smoking cessation.  If her cardiac work-up does not reveal a etiology for her shortness of breath, would follow this up with pulmonary function tests.  Physical exam is abnormal, with dry crackles heard throughout all lung fields that I would suspect are a sign of pulmonary fibrosis, but the chest CT does not show clear changes of interstitial lung disease. Abnormal lung exam: No clear specific pathology on chest CT, but may need a dedicated high-resolution study.           Medication Adjustments/Labs and Tests Ordered: Current medicines are reviewed at length with the patient today.  Concerns regarding medicines are outlined above.  Orders Placed This Encounter  Procedures   CT CORONARY MORPH W/CTA COR W/SCORE W/CA W/CM &/OR WO/CM   Basic Metabolic Panel (BMET)   LONG TERM MONITOR  (3-14 DAYS)   EKG 12-Lead   ECHOCARDIOGRAM COMPLETE   No orders of the defined types were placed in this encounter.   Patient Instructions  Medication Instructions:  No changes *If you need a refill on your cardiac medications before your next appointment, please call your pharmacy*   Lab Work: Your provider would like for you to have the following labs today: BMET  If you have labs (blood work) drawn today and your tests are completely normal, you will receive your results only by: [place] (if you have MyChart) OR A paper copy in the mail If you have any lab test that is abnormal or we need to change your treatment, we will call you to review the results.   Testing/Procedures: Your physician has requested that you have an echocardiogram. Echocardiography is a painless test that uses sound waves to create images of your heart. It provides your doctor with information about the size and shape of your heart and how well your heart's chambers and valves are working. You may receive an ultrasound enhancing agent through an IV if needed to better visualize your heart during the echo.This procedure takes approximately one hour. There are no restrictions for this procedure. This will take place at the [address], [address].     Follow-Up: At [facility], you and your health needs are our priority.  As part of our continuing mission to provide you with exceptional heart care, we have created designated Provider Care Teams.  These Care Teams include your primary Cardiologist (physician) and Advanced Practice Providers (APPs -  Physician Assistants and Nurse Practitioners) who all work together to provide  you with the care you need, when you need it.  We recommend signing up for the patient portal called "MyChart".  Sign up information is provided on this After Visit Summary.  MyChart is used to connect with patients for Virtual Visits (Telemedicine).  Patients are able  to view lab/test results, encounter notes, upcoming appointments, etc.  Non-urgent messages can be sent to your provider as well.   To learn more about what you can do with MyChart, go to [email or web address].    Your next appointment:   Follow up in 6 weeks with Dr. Sallyanne Kuster   Other Instructions   Your cardiac CT will be scheduled at one of the below locations:   [facility] [address] [place], [place] [address] [phone]  [facility] [address] [place], [place] [address] [phone]  [facility] [place], [place] [address] [phone]  If scheduled at [facility], please arrive at the [facility] and Children's Entrance (Entrance C2) of [facility] 30 minutes prior to test start time. You can use the FREE valet parking offered at entrance C (encouraged to control the heart rate for the test)  Proceed to the [facility] Radiology Department (first floor) to check-in and test prep.  All radiology patients and guests should use entrance C2 at [facility], accessed from [facility], even though the hospital's physical address listed is [address].    If scheduled at [facility] or [facility], please arrive 15 mins early for check-in and test prep.   Please follow these instructions carefully (unless otherwise directed):   We will administer nitroglycerin during this exam.   On the Night Before the Test: Be sure to Drink plenty of water. Do not consume any caffeinated/decaffeinated beverages or chocolate 12 hours prior to your test. Do not take any antihistamines 12 hours prior to your test.  On the Day of the Test: Drink plenty of water until 1 hour prior to the test. Do not eat any food 1 hour prior to test. You may take your regular medications prior to  the test.  Take an extra 25 mg of Atenolol two hours prior to test. HOLD Hydrochlorothiazide morning of the test. FEMALES- please wear underwire-free bra if available, avoid dresses & tight clothing       After the Test: Drink plenty of water. After receiving IV contrast, you may experience a mild flushed feeling. This is normal. On occasion, you may experience a mild rash up to 24 hours after the test. This is not dangerous. If this occurs, you can take Benadryl 25 mg and increase your fluid intake. If you experience trouble breathing, this can be serious. If it is severe call 911 IMMEDIATELY. If it is mild, please call our office. If you take any of these medications: Glipizide/Metformin, Avandament, Glucavance, please do not take 48 hours after completing test unless otherwise instructed.  We will call to schedule your test 2-4 weeks out understanding that some insurance companies will need an authorization prior to the service being performed.   For non-scheduling related questions, please contact the cardiac imaging nurse navigator should you have any questions/concerns: Marchia Bond, Cardiac Imaging Nurse Navigator Gordy Clement, Cardiac Imaging Nurse Navigator [facility] Direct Office Dial: [phone]   For scheduling needs, including cancellations and rescheduling, please call [place], [phone].  ZIO XT- Long Term Monitor Instructions  Your physician has requested you wear a ZIO patch monitor for 7 days.  This is a single patch monitor. [employer] supplies one patch monitor per enrollment. Additional stickers are not available. Please do not apply patch if you will be having a Nuclear Stress Test,  Echocardiogram, Cardiac CT, MRI, or Chest Xray during the period you would be wearing the  monitor. The patch cannot be worn during these tests. You cannot remove and re-apply the  ZIO XT patch monitor.  Your ZIO patch monitor will be mailed 3 day  USPS to your address on file. It may take 3-5 days  to receive your monitor after you have been enrolled.  Once you have received your monitor, please review the enclosed instructions. Your monitor  has already been registered assigning a specific monitor serial # to you.  Billing and Patient Assistance Program Information  We have supplied [employer] with any of your insurance information on file for billing purposes. [employer] offers a sliding scale Patient Assistance Program for patients that do not have  insurance, or whose insurance does not completely cover the cost of the ZIO monitor.  You must apply for the Patient Assistance Program to qualify for this discounted rate.  To apply, please call [employer] at [phone], select option 4, select option 2, ask to apply for  Patient Assistance Program. Theodore Demark will ask your household income, and how many people  are in your household. They will quote your out-of-pocket cost based on that information.  [employer] will also be able to set up a 45-month interest-free payment plan if needed.  Applying the monitor   Shave hair from upper left chest.  Hold abrader disc by orange tab. Rub abrader in 40 strokes over the upper left chest as  indicated in your monitor instructions.  Clean area with 4 enclosed alcohol pads. Let dry.  Apply patch as indicated in monitor instructions. Patch will be placed under collarbone on left  side of chest with arrow pointing upward.  Rub patch adhesive wings for 2 minutes. Remove white label marked "1". Remove the white  label marked "2". Rub patch adhesive wings for 2 additional minutes.  While looking in a mirror, press and release button in center of patch. A small green light will  flash 3-4 times. This will be your only indicator that the monitor has been turned on.  Do not shower for the first 24 hours. You may shower after the first 24 hours.  Press the button if you feel a symptom. You will hear a small  click. Record Date, Time and  Symptom in the Patient Logbook.  When you are ready to remove the patch, follow instructions on the last 2 pages of Patient  Logbook. Stick patch monitor onto the last page of Patient Logbook.  Place Patient Logbook in the blue and white box. Use locking tab on box and tape box closed  securely. The blue and white box has prepaid postage on it. Please place it in the mailbox as  soon as possible. Your physician should have your test results approximately 7 days after the  monitor has been mailed back to [facility]  Call [place] [phone] you have questions regarding  your ZIO XT patch monitor. Call them immediately if you see an orange light blinking on your  monitor.  If your monitor falls off in less than 4 days, contact our Monitor department at [phone]  If  your monitor becomes loose or falls off after 4 days call [employer] at [phone] for  suggestions on securing your monitor     Signed, Sanda Klein, MD  05/02/2022 12:49 PM    [place]

## 2022-05-02 NOTE — Patient Instructions (Addendum)
Medication Instructions:  No changes *If you need a refill on your cardiac medications before your next appointment, please call your pharmacy*   Lab Work: Your provider would like for you to have the following labs today: BMET  If you have labs (blood work) drawn today and your tests are completely normal, you will receive your results only by: [place] (if you have MyChart) OR A paper copy in the mail If you have any lab test that is abnormal or we need to change your treatment, we will call you to review the results.   Testing/Procedures: Your physician has requested that you have an echocardiogram. Echocardiography is a painless test that uses sound waves to create images of your heart. It provides your doctor with information about the size and shape of your heart and how well your heart's chambers and valves are working. You may receive an ultrasound enhancing agent through an IV if needed to better visualize your heart during the echo.This procedure takes approximately one hour. There are no restrictions for this procedure. This will take place at the [address].     Follow-Up: At [facility], you and your health needs are our priority.  As part of our continuing mission to provide you with exceptional heart care, we have created designated Provider Care Teams.  These Care Teams include your primary Cardiologist (physician) and Advanced Practice Providers (APPs -  Physician Assistants and Nurse Practitioners) who all work together to provide you with the care you need, when you need it.  We recommend signing up for the patient portal called "MyChart".  Sign up information is provided on this After Visit Summary.  MyChart is used to connect with patients for Virtual Visits (Telemedicine).  Patients are able to view lab/test results, encounter notes, upcoming appointments, etc.  Non-urgent messages can be sent to your provider as well.   To learn more  about what you can do with MyChart, go to [email or web address].    Your next appointment:   Follow up in 6 weeks with Dr. Sallyanne Kuster   Other Instructions   Your cardiac CT will be scheduled at one of the below locations:   [facility] [address] [place], [place] [address] [phone]  [place] [address] [place], [place] [address] [phone]  [facility] [place], [place] [address] [phone]  If scheduled at [facility], please arrive at the [facility] and Children's Entrance (Entrance C2) of [facility] 30 minutes prior to test start time. You can use the FREE valet parking offered at entrance C (encouraged to control the heart rate for the test)  Proceed to the [facility] Radiology Department (first floor) to check-in and test prep.  All radiology patients and guests should use entrance C2 at [facility], accessed from [facility], even though the hospital's physical address listed is [address]    If scheduled at [facility] or [facility], please arrive 15 mins early for check-in and test prep.   Please follow these instructions carefully (unless otherwise directed):   We will administer nitroglycerin during this exam.   On the Night Before the Test: Be sure to Drink plenty of water. Do not consume any caffeinated/decaffeinated beverages or chocolate 12 hours prior to your test. Do not take any antihistamines 12 hours prior  to your test.  On the Day of the Test: Drink plenty of water until 1 hour prior to the test. Do not eat any food 1 hour prior to test. You may take your regular medications prior to the test.  Take an extra 25 mg of Atenolol two hours prior to test. HOLD Hydrochlorothiazide morning of the test. FEMALES- please wear  underwire-free bra if available, avoid dresses & tight clothing       After the Test: Drink plenty of water. After receiving IV contrast, you may experience a mild flushed feeling. This is normal. On occasion, you may experience a mild rash up to 24 hours after the test. This is not dangerous. If this occurs, you can take Benadryl 25 mg and increase your fluid intake. If you experience trouble breathing, this can be serious. If it is severe call 911 IMMEDIATELY. If it is mild, please call our office. If you take any of these medications: Glipizide/Metformin, Avandament, Glucavance, please do not take 48 hours after completing test unless otherwise instructed.  We will call to schedule your test 2-4 weeks out understanding that some insurance companies will need an authorization prior to the service being performed.   For non-scheduling related questions, please contact the cardiac imaging nurse navigator should you have any questions/concerns: Marchia Bond, Cardiac Imaging Nurse Navigator Gordy Clement, Cardiac Imaging Nurse Navigator [facility] Direct Office Dial: [phone]   For scheduling needs, including cancellations and rescheduling, please call [place], [phone].  ZIO XT- Long Term Monitor Instructions  Your physician has requested you wear a ZIO patch monitor for 7 days.  This is a single patch monitor. [employer] supplies one patch monitor per enrollment. Additional stickers are not available. Please do not apply patch if you will be having a Nuclear Stress Test,  Echocardiogram, Cardiac CT, MRI, or Chest Xray during the period you would be wearing the  monitor. The patch cannot be worn during these tests. You cannot remove and re-apply the  ZIO XT patch monitor.  Your ZIO patch monitor will be mailed 3 day USPS to your address on file. It may take 3-5 days  to receive your monitor after you have been enrolled.  Once you have received your  monitor, please review the enclosed instructions. Your monitor  has already been registered assigning a specific monitor serial # to you.  Billing and Patient Assistance Program Information  We have supplied [employer] with any of your insurance information on file for billing purposes. [employer] offers a sliding scale Patient Assistance Program for patients that do not have  insurance, or whose insurance does not completely cover the cost of the ZIO monitor.  You must apply for the Patient Assistance Program to qualify for this discounted rate.  To apply, please call [employer] at [phone], select option 4, select option 2, ask to apply for  Patient Assistance Program. Theodore Demark will ask your household income, and how many people  are in your household. They will quote your out-of-pocket cost based on that information.  [employer] will also be able to set up a 20-month interest-free payment plan if needed.  Applying the monitor   Shave hair from upper left chest.  Hold abrader disc by orange tab. Rub abrader in 40 strokes over the upper left chest as  indicated in your monitor instructions.  Clean area with 4 enclosed alcohol pads. Let dry.  Apply patch as indicated in monitor instructions. Patch will be placed under collarbone on left  side of chest with arrow pointing upward.  Rub patch adhesive wings for 2 minutes. Remove white label marked "1". Remove the white  label marked "2". Rub patch adhesive wings for 2 additional minutes.  While looking in a mirror, press and release button in center of patch. A small green light will  flash 3-4 times. This will be your only indicator that the monitor has been turned on.  Do not shower for the first 24 hours. You may shower after the first 24 hours.  Press the button if you feel a symptom. You will hear a small click. Record Date, Time and  Symptom in the Patient Logbook.  When you are ready to remove the patch, follow instructions on the last 2  pages of Patient  Logbook. Stick patch monitor onto the last page of Patient Logbook.  Place Patient Logbook in the blue and white box. Use locking tab on box and tape box closed  securely. The blue and white box has prepaid postage on it. Please place it in the mailbox as  soon as possible. Your physician should have your test results approximately 7 days after the  monitor has been mailed back to [facility]  Call [place] at [phone] if you have questions regarding  your ZIO XT patch monitor. Call them immediately if you see an orange light blinking on your  monitor.  If your monitor falls off in less than 4 days, contact our Monitor department at [phone].  If your monitor becomes loose or falls off after 4 days call [employer] at [phone] for  suggestions on securing your monitor

## 2022-05-03 LAB — BASIC METABOLIC PANEL
BUN/Creatinine Ratio: 23 (ref 12–28)
BUN: 14 mg/dL (ref 8–27)
CO2: 25 mmol/L (ref 20–29)
Calcium: 9.6 mg/dL (ref 8.7–10.3)
Chloride: 101 mmol/L (ref 96–106)
Creatinine, Ser: 0.6 mg/dL (ref 0.57–1.00)
Glucose: 90 mg/dL (ref 70–99)
Potassium: 4.1 mmol/L (ref 3.5–5.2)
Sodium: 143 mmol/L (ref 134–144)
eGFR: 91 mL/min/{1.73_m2} (ref >59)

## 2022-05-04 ENCOUNTER — Encounter: Payer: Self-pay | Admitting: *Deleted

## 2022-05-05 DIAGNOSIS — R002 Palpitations: Secondary | ICD-10-CM

## 2022-05-15 ENCOUNTER — Telehealth (HOSPITAL_COMMUNITY): Payer: Self-pay | Admitting: *Deleted

## 2022-05-15 NOTE — Telephone Encounter (Signed)
Reaching out to patient to offer assistance regarding upcoming cardiac imaging study; pt verbalizes understanding of appt date/time, parking situation and where to check in, and verified current allergies; name and call back number provided for further questions should they arise  Gordy Clement RN Navigator Cardiac Imaging [facility] [phone] office [phone] cell  Patient to take her daily atenolol two hours prior to her cardiac CT scan. She is aware to arrive at 1:30pm.

## 2022-05-16 ENCOUNTER — Ambulatory Visit (HOSPITAL_COMMUNITY): Payer: Medicare Other

## 2022-05-16 ENCOUNTER — Ambulatory Visit (HOSPITAL_COMMUNITY)
Admission: RE | Admit: 2022-05-16 | Discharge: 2022-05-16 | Disposition: A | Discharge status: Home / Self Care | Payer: Medicare Other | Source: Ambulatory Visit | Attending: Cardiovascular Disease | Admitting: Cardiovascular Disease

## 2022-05-16 DIAGNOSIS — R0602 Shortness of breath: Secondary | ICD-10-CM | POA: Insufficient documentation

## 2022-05-16 DIAGNOSIS — R072 Precordial pain: Secondary | ICD-10-CM | POA: Diagnosis present

## 2022-05-16 LAB — ECHOCARDIOGRAM COMPLETE
Area-P 1/2: 3.05 cm2
S' Lateral: 2.3 cm

## 2022-05-16 MED ORDER — IOHEXOL 350 MG/ML SOLN
100.0000 mL | Freq: Once | INTRAVENOUS | Status: AC | PRN
  Administered 2022-05-16: 100 mL via INTRAVENOUS

## 2022-05-16 MED ORDER — NITROGLYCERIN 0.4 MG SL SUBL
SUBLINGUAL_TABLET | SUBLINGUAL | Status: AC
  Filled 2022-05-16: qty 2

## 2022-05-16 MED ORDER — NITROGLYCERIN 0.4 MG SL SUBL
0.8000 mg | SUBLINGUAL_TABLET | Freq: Once | SUBLINGUAL | Status: AC
  Administered 2022-05-16: 0.8 mg via SUBLINGUAL

## 2022-06-08 ENCOUNTER — Ambulatory Visit: Payer: Medicare Other | Admitting: Orthopaedic Surgery

## 2022-06-08 ENCOUNTER — Ambulatory Visit (INDEPENDENT_AMBULATORY_CARE_PROVIDER_SITE_OTHER): Payer: Medicare Other

## 2022-06-08 ENCOUNTER — Encounter: Payer: Self-pay | Admitting: Orthopaedic Surgery

## 2022-06-08 DIAGNOSIS — M25511 Pain in right shoulder: Secondary | ICD-10-CM

## 2022-06-08 DIAGNOSIS — G8929 Other chronic pain: Secondary | ICD-10-CM

## 2022-06-08 MED ORDER — BUPIVACAINE HCL 0.25 % IJ SOLN
2.0000 mL | INTRAMUSCULAR | Status: AC | PRN
  Administered 2022-06-08: 2 mL via INTRA_ARTICULAR

## 2022-06-08 MED ORDER — LIDOCAINE HCL 2 % IJ SOLN
2.0000 mL | INTRAMUSCULAR | Status: AC | PRN
  Administered 2022-06-08: 2 mL

## 2022-06-08 MED ORDER — METHYLPREDNISOLONE ACETATE 40 MG/ML IJ SUSP
80.0000 mg | INTRAMUSCULAR | Status: AC | PRN
  Administered 2022-06-08: 80 mg via INTRA_ARTICULAR

## 2022-06-08 NOTE — Progress Notes (Signed)
Office Visit Note   Patient: Karina [name]           Date of Birth: 08-27-1940           MRN: [id number] Visit Date: 06/08/2022              Requested by: Prince Solian, MD [address] [place],  [place] [address] PCP: Prince Solian, MD   Assessment & Plan: Visit Diagnoses:  1. Chronic right shoulder pain     Plan: Karina [name] relates at least a 3 to 52-monthhistory of insidious onset right shoulder pain.  She denies any history of or trauma.  Her past history is significant that she has had prior surgery by Dr. CFrench Anamany years ago that included an open rotator cuff tear repair.  She is status post left rotator cuff tear repair within the past year and is done quite well.  She is having difficulty sleeping laying on that right side and difficulty raising her arm over her head.  She does live by herself and wants to remain independent.  X-rays demonstrate some narrowing of the humeral head acromial distance and some glenohumeral arthritis.  She certainly could have a rotator cuff arthropathy.  Long discussion regarding different treatment options and diagnostic possibilities.  She preferred to try a cortisone injection before we proceed with any further diagnostic testing.  I have urged her to call me over the next several weeks if she has not improved and we will order an MRI scan of the right shoulder Follow-Up Instructions: Return in about 1 month (around 07/08/2022).   Orders:  Orders Placed This Encounter  Procedures   XR Shoulder Right   No orders of the defined types were placed in this encounter.     Procedures: Large Joint Inj: R subacromial bursa on 06/08/2022 2:21 PM Indications: pain and diagnostic evaluation Details: 25 G 1.5 in needle, anterolateral approach  Arthrogram: No  Medications: 2 mL lidocaine 2 %; 80 mg methylPREDNISolone acetate 40 MG/ML; 2 mL bupivacaine 0.25 % Consent was given by the patient. Immediately prior to procedure a time out was  called to verify the correct patient, procedure, equipment, support staff and site/side marked as required. Patient was prepped and draped in the usual sterile fashion.       Clinical Data: No additional findings.   Subjective: Chief Complaint  Patient presents with   Right Shoulder - Pain  Patient presents today for right shoulder pain. She said that it started about 3-458monthago. No known injury. She has pain throughout her shoulder and into her arm. She feels like it is getting worse with time. She has limited range of motion. She has been dropping things because it seems weak. She is right hand dominant. She takes Tylenol and uses Voltaren gel.  Has had prior surgery for rotator cuff tear repair of the same shoulder years ago by Dr. CaFrench Ana She is had left rotator cuff tear repair within the past year and doing well.  HPI  Review of Systems   Objective: Vital Signs: There were no vitals taken for this visit.  Physical Exam Constitutional:      Appearance: She is well-developed.  Pulmonary:     Effort: Pulmonary effort is normal.  Skin:    General: Skin is warm and dry.  Neurological:     Mental Status: She is alert and oriented to person, place, and time.  Psychiatric:        Behavior: Behavior normal.  Ortho Exam awake and alert.  Comfortable in no acute distress.  Having difficulty raising her right arm overhead with a painful arc of motion.  She could, however, maintain that position and full flexion.  Good strength with internal/external rotation.  No crepitation but pain on the extreme of impingement testing.  Also positive empty can testing and a positive speeds sign.  I think the biceps tendon is intact.  Skin intact.  Neurologically intact.  Good grip and release.  No related neck pain  Specialty Comments:  No specialty comments available.  Imaging: XR Shoulder Right  Result Date: 06/08/2022 Films of the right shoulder obtained in 3 projections.  There  appears to be some glenohumeral arthritis with narrowing of the joint space and a small inferior humeral head spur.  There is some irregularity of the humeral head.  In addition there is some narrowing of the space between the humeral head and the acromion which is downsloping.  Certainly could be consistent with rotator cuff arthropathy.    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   Primary osteoarthritis of right hip 03/11/2018   Past Medical History:  Diagnosis Date   Arthritis    Chronic bladder pain    Chronic low back pain    Diverticulosis    Family history of adverse reaction to anesthesia    sister-N/V   Full dentures    GERD (gastroesophageal reflux disease)    Hiatal hernia    History of malignant melanoma 1978   per pt resection right calf area, no lymph node removal, localized ,  had 3 rounds of chemo completed 1978;  pt states no recurrence   History of panic attacks    HTN (hypertension)    followed by pcp----  (nuclear stress test in epic 11-29-2009 normal   Hypercholesterolemia    Hypothyroidism, postsurgical    followed by pcp---  s/p  left thryoid lobectomy 1990s, no cancer   IC (interstitial cystitis)    urologist--- dr Jeffie Pollock   Osteoporosis    Wears glasses     History reviewed. No pertinent family history.  Past Surgical History:  Procedure Laterality Date   APPENDECTOMY  1957   CATARACT EXTRACTION W/ INTRAOCULAR LENS IMPLANT Bilateral 2020   CYSTOSCOPY WITH URETHRAL DILATATION N/A 02/22/2021   Procedure: CYSTOSCOPY WITH URETHRAL DILATATION HYDRODISTENTION OF BLADDER AND INSTILLATION OF MARICARE AND PYRIDIUM;  Surgeon: Irine Seal, MD;  Location: [facility];  Service: Urology;  Laterality: N/A;  [place] SURGERY  2014   approx---  (left forehead)   ORIF  WRIST FRACTURE Left 2018   OVARIAN CYST REMOVAL     laparatomy x2  last one age 48   SHOULDER ARTHROSCOPY Left 2004   '@[facility]'$    SHOULDER OPEN ROTATOR CUFF REPAIR Right 2006   THYROIDECTOMY, PARTIAL Left    1990s   TONSILLECTOMY     child   TOTAL ABDOMINAL HYSTERECTOMY W/ BILATERAL SALPINGOOPHORECTOMY  1971   PER PT TOLD REMNANT OF ONE OVARY RETAINED   TOTAL HIP ARTHROPLASTY Right 05/07/2018   Procedure: RIGHT TOTAL HIP ARTHROPLASTY;  Surgeon: Garald Balding, MD;  Location: [facility];  Service: Orthopedics;  Laterality: Right;   Social History   Occupational History   Not on file  Tobacco Use  Smoking status: Former    Years: 35.00    Types: Cigarettes    Quit date: 01/2020    Years since quitting: 2.3   Smokeless tobacco: Never  Vaping Use   Vaping Use: Never used  Substance and Sexual Activity   Alcohol use: No   Drug use: Never   Sexual activity: Not on file

## 2022-06-21 ENCOUNTER — Encounter: Payer: Self-pay | Admitting: Cardiovascular Disease

## 2022-06-21 ENCOUNTER — Ambulatory Visit: Payer: Medicare Other | Attending: Cardiovascular Disease | Admitting: Cardiovascular Disease

## 2022-06-21 VITALS — BP 110/82 | HR 74 | Ht 60.0 in | Wt 126.6 lb

## 2022-06-21 DIAGNOSIS — F17200 Nicotine dependence, unspecified, uncomplicated: Secondary | ICD-10-CM | POA: Diagnosis not present

## 2022-06-21 DIAGNOSIS — R002 Palpitations: Secondary | ICD-10-CM | POA: Diagnosis not present

## 2022-06-21 DIAGNOSIS — R0602 Shortness of breath: Secondary | ICD-10-CM | POA: Diagnosis not present

## 2022-06-21 DIAGNOSIS — R918 Other nonspecific abnormal finding of lung field: Secondary | ICD-10-CM

## 2022-06-21 DIAGNOSIS — R072 Precordial pain: Secondary | ICD-10-CM | POA: Diagnosis not present

## 2022-06-21 NOTE — Progress Notes (Signed)
Cardiology Office Note:    Date:  06/25/2022   ID:  Verdis Frederickson, DOB 05-08-1941, MRN [id number]  PCP:  Prince Solian, MD   [facility] Cardiologist:  Sanda Klein, MD     Referring MD: Prince Solian, MD   Chief Complaint  Patient presents with   Follow-up    History of Present Illness:    Karina [name] is a 81 y.o. female with a hx of hypertension, hypercholesterolemia, postsurgical hypothyroidism, GERD, irritable bowel syndrome, osteoporosis, interstitial cystitis ongoing light smoker referred for palpitations and a subjective complaint of shortness of breath.  She returns in follow-up after undergoing an echocardiogram, coronary CT angiogram and wearing an arrhythmia monitor.  The echocardiogram showed normal left ventricular systolic function, very mild diastolic function and no evidence of elevated filling pressures or valvular abnormalities.  The coronary CT angiogram showed no evidence of any coronary stenoses and an excellent coronary calcium score of only 1 (15th percentile), although there was scattered aortic atherosclerosis.  The event monitor demonstrated that her palpitations are related to PACs and brief bursts of ectopic atrial tachycardia.  There was no atrial fibrillation.  Incidentally noted on her CT was evidence of mild diffuse bronchial wall thickening and tiny clustered left upper lobe pulmonary nodules, the largest measuring only 3 mm.  She has continue to walk her dog with only mild exertional dyspnea.  She has a history of statin myopathy and takes Repatha for roughly the last year. She lives alone, but is close to her younger sister [name] who is also my patient.  Continues to smoke 2-3 cigarettes a day.  She smoked roughly a pack a day for close to 50 years.  Most recent lipid profile from July shows an LDL of 46 and HDL of 76.  She has normal blood pressure.  She does not have diabetes mellitus.  Past Medical History:   Diagnosis Date   Arthritis    Chronic bladder pain    Chronic low back pain    Diverticulosis    Family history of adverse reaction to anesthesia    sister-N/V   Full dentures    GERD (gastroesophageal reflux disease)    Hiatal hernia    History of malignant melanoma 1978   per pt resection right calf area, no lymph node removal, localized ,  had 3 rounds of chemo completed 1978;  pt states no recurrence   History of panic attacks    HTN (hypertension)    followed by pcp----  (nuclear stress test in epic 11-29-2009 normal   Hypercholesterolemia    Hypothyroidism, postsurgical    followed by pcp---  s/p  left thryoid lobectomy 1990s, no cancer   IC (interstitial cystitis)    urologist--- dr Jeffie Pollock   Osteoporosis    Wears glasses     Past Surgical History:  Procedure Laterality Date   APPENDECTOMY  1957   CATARACT EXTRACTION W/ INTRAOCULAR LENS IMPLANT Bilateral 2020   CYSTOSCOPY WITH URETHRAL DILATATION N/A 02/22/2021   Procedure: CYSTOSCOPY WITH URETHRAL DILATATION HYDRODISTENTION OF BLADDER AND INSTILLATION OF MARICARE AND PYRIDIUM;  Surgeon: Irine Seal, MD;  Location: [facility];  Service: Urology;  Laterality: N/A;  [facility] SURGERY  2014   approx---  (left forehead)   ORIF WRIST FRACTURE Left 2018   OVARIAN CYST REMOVAL     laparatomy x2  last one age 26   SHOULDER ARTHROSCOPY Left 2004   '@[facility]'$    SHOULDER OPEN ROTATOR  CUFF REPAIR Right 2006   THYROIDECTOMY, PARTIAL Left    1990s   TONSILLECTOMY     child   TOTAL ABDOMINAL HYSTERECTOMY W/ BILATERAL SALPINGOOPHORECTOMY  1971   PER PT TOLD REMNANT OF ONE OVARY RETAINED   TOTAL HIP ARTHROPLASTY Right 05/07/2018   Procedure: RIGHT TOTAL HIP ARTHROPLASTY;  Surgeon: Garald Balding, MD;  Location: [facility];  Service: Orthopedics;  Laterality: Right;    Current Medications: Current Meds  Medication Sig   acetaminophen (TYLENOL) 500 MG tablet Take 500 mg by mouth every 6 (six) hours as  needed.   ALPRAZolam (XANAX) 0.5 MG tablet Take 0.5 mg by mouth 2 (two) times daily.   aspirin 81 MG chewable tablet Chew 1 tablet (81 mg total) by mouth 2 (two) times daily. (Patient taking differently: Chew 81 mg by mouth daily.)   atenolol (TENORMIN) 50 MG tablet Take 50 mg by mouth daily.   augmented betamethasone dipropionate (DIPROLENE-AF) 0.05 % cream Apply topically 2 (two) times daily.   calcium carbonate (OS-CAL) 600 MG TABS tablet Take 600 mg by mouth daily.   celecoxib (CELEBREX) 200 MG capsule Take 200 mg by mouth daily.   Coenzyme Q10 (CO Q 10) 100 MG CAPS Take 100 mg by mouth daily.   ergocalciferol (VITAMIN D2) 50000 units capsule Take 50,000 Units by mouth 2 (two) times a week.   levothyroxine (SYNTHROID) 88 MCG tablet Take 88 mcg by mouth daily before breakfast.   olmesartan-hydrochlorothiazide (BENICAR HCT) 20-12.5 MG tablet Take 1 tablet by mouth daily.   omeprazole (PRILOSEC) 20 MG capsule Take 20 mg by mouth daily. Per pt and takes if needed in the evening   REPATHA SURECLICK 536 MG/ML SOAJ Inject 1 mL into the skin every 14 (fourteen) days.   risedronate (ACTONEL) 150 MG tablet Take 150 mg by mouth every 30 (thirty) days.   sertraline (ZOLOFT) 100 MG tablet Take 100 mg by mouth every morning.   Vitamin E 200 units TABS Take 200 Units by mouth daily.   ZETIA 10 MG tablet Take 10 mg by mouth at bedtime.      Allergies:   Morphine and Propoxyphene hcl   Social History   Socioeconomic History   Marital status: Single    Spouse name: Not on file   Number of children: Not on file   Years of education: Not on file   Highest education level: Not on file  Occupational History   Not on file  Tobacco Use   Smoking status: Former    Years: 35.00    Types: Cigarettes    Quit date: 01/2020    Years since quitting: 2.4   Smokeless tobacco: Never  Vaping Use   Vaping Use: Never used  Substance and Sexual Activity   Alcohol use: No   Drug use: Never   Sexual activity:  Not on file  Other Topics Concern   Not on file  Social History Narrative   Not on file   Social Determinants of Health   Financial Resource Strain: Not on file  Food Insecurity: Not on file  Transportation Needs: Not on file  Physical Activity: Not on file  Stress: Not on file  Social Connections: Not on file     Family History: The patient's 3 significant for mitral valve prolapse and left bundle branch block in her mother, syncope and pacemaker implantation in her older sister.  ROS:   Please see the history of present illness.     All other systems  reviewed and are negative.  EKGs/Labs/Other Studies Reviewed:    The following studies were reviewed today: Notes and labs from primary care provider  Coronary CT angiogram 05/16/2022 IMPRESSION: 1.  Minimal nonobstructive CAD, CADRADS = 1. 2. Coronary calcium score of 1. This was 65 percentile for age and sex matched control. 3. Normal coronary origin with right dominance. 4.  Scattered aortic atherosclerosis.   1. Tiny clustered left upper lobe pulmonary nodules measure up to 3 mm, favored to reflect an infectious or inflammatory process. No follow-up needed if patient is low-risk (and has no known or suspected primary neoplasm). Non-contrast chest CT can be considered in 12 months if patient is high-risk. This recommendation follows the consensus statement: Guidelines for Management of Incidental Pulmonary Nodules Detected on CT Images: From the Fleischner Society 2017; Radiology 2017; 284:228-243. 2. Small hiatal hernia. 3. Mild diffuse bronchial wall thickening, which can be seen in the setting of bronchitis. 4.  Aortic Atherosclerosis (ICD10-I70.0).   Echocardiogram 05/16/2022   1. Left ventricular ejection fraction by 3D volume is 55 %. The left  ventricle has normal function. The left ventricle has no regional wall  motion abnormalities. Left ventricular diastolic parameters are consistent  with Grade I  diastolic dysfunction  (impaired relaxation).   2. Right ventricular systolic function is normal. The right ventricular  size is normal.   3. The mitral valve is normal in structure. Trivial mitral valve  regurgitation. No evidence of mitral stenosis.   4. The aortic valve is tricuspid. There is mild calcification of the  aortic valve. There is mild thickening of the aortic valve. Aortic valve  regurgitation is mild. Aortic valve sclerosis is present, with no evidence  of aortic valve stenosis.   5. The inferior vena cava is normal in size with greater than 50%  respiratory variability, suggesting right atrial pressure of 3 mmHg.   Event monitor 05/22/2022    The dominant rhythm is normal sinus rhythm with normal circadian variation.   There are frequent but very brief episodes of ectopic atrial tachycardia, the longest being only 22 seconds in duration.  There is no evidence of atrial fibrillation.   There is no evidence of severe bradycardia or complex ventricular arrhythmia.   Abnormal event monitor due to occasional episodes of brief ectopic atrial tachycardia.  Atrial fibrillation is not seen.  EKG:  EKG is not ordered today.  The ekg ordered at her last appointment a month ago demonstrates normal sinus rhythm, nonspecific repolarization changes with T wave inversions in leads III, aVF, V3-V4, normal QTc 432 ms  Recent Labs: 05/02/2022: BUN 14; Creatinine, Ser 0.60; Potassium 4.1; Sodium 143  02/17/2022 Glucose 82, normal liver function test, creatinine 0.7, potassium 3.6, hemoglobin 14.1, TSH 3.54, free T41.2 Recent Lipid Panel No results found for: "CHOL", "TRIG", "HDL", "CHOLHDL", "VLDL", "LDLCALC", "LDLDIRECT" 02/17/2022 Cholesterol 148, triglycerides 130, HDL 76, LDL 46  Risk Assessment/Calculations:          Physical Exam:    VS:  BP 110/82 (BP Location: Left Arm, Patient Position: Sitting, Cuff Size: Normal)   Pulse 74   Ht 5' (1.524 m)   Wt 57.4 kg   SpO2 95%    BMI 24.72 kg/m     Wt Readings from Last 3 Encounters:  06/21/22 57.4 kg  05/02/22 57.8 kg  02/22/21 57.2 kg      General: Alert, oriented x3, no distress, appears well Head: no evidence of trauma, PERRL, EOMI, no exophtalmos or lid lag, no myxedema,  no xanthelasma; normal ears, nose and oropharynx Neck: normal jugular venous pulsations and no hepatojugular reflux; brisk carotid pulses without delay and no carotid bruits Chest: Few dry crackles in the bases bilaterally Cardiovascular: normal position and quality of the apical impulse, regular rhythm, normal first and second heart sounds, no murmurs, rubs or gallops Abdomen: no tenderness or distention, no masses by palpation, no abnormal pulsatility or arterial bruits, normal bowel sounds, no hepatosplenomegaly Extremities: no clubbing, cyanosis or edema; 2+ radial, ulnar and brachial pulses bilaterally; 2+ right femoral, posterior tibial and dorsalis pedis pulses; 2+ left femoral, posterior tibial and dorsalis pedis pulses; no subclavian or femoral bruits Neurological: grossly nonfocal Psych: Normal mood and affect   ASSESSMENT:    1. Shortness of breath   2. Precordial pain   3. Palpitations   4. Smoking   5. Lung field abnormal finding on examination     PLAN:    In order of problems listed above:  Exertional dyspnea: Suspect is related to smoking and may be some type of interstitial lung disease. Chest pain: Atypical and the coronary CT angiogram shows no evidence of any significant CAD.  Further evaluation for cardiac source is not indicated. Palpitations: She has occasional PACs and brief bursts of atrial tachycardia, which can commonly be seen with lung disease.  She does not have atrial fibrillation.  No specific therapy is necessary except for alleviation of symptoms.  She prefers not to take more medications. Smoking: Strongly recommend that completely stopping smoking Abnormal lung exam: Consider evaluation by  pulmonary specialist with pulmonary function test and high-resolution CT.           Medication Adjustments/Labs and Tests Ordered: Current medicines are reviewed at length with the patient today.  Concerns regarding medicines are outlined above.  Orders Placed This Encounter  Procedures   Ambulatory referral to Pulmonology   No orders of the defined types were placed in this encounter.   Patient Instructions  Medication Instructions:  The current medical regimen is effective;  continue present plan and medications.  *If you need a refill on your cardiac medications before your next appointment, please call your pharmacy*   Follow-Up: At [facility], you and your health needs are our priority.  As part of our continuing mission to provide you with exceptional heart care, we have created designated Provider Care Teams.  These Care Teams include your primary Cardiologist (physician) and Advanced Practice Providers (APPs -  Physician Assistants and Nurse Practitioners) who all work together to provide you with the care you need, when you need it.  We recommend signing up for the patient portal called "MyChart".  Sign up information is provided on this After Visit Summary.  MyChart is used to connect with patients for Virtual Visits (Telemedicine).  Patients are able to view lab/test results, encounter notes, upcoming appointments, etc.  Non-urgent messages can be sent to your provider as well.   To learn more about what you can do with MyChart, go to [email or web address].    Your next appointment:   As needed  The format for your next appointment:   In Person  Provider:   Sanda Klein, MD     Sent to pulmonology- they will call you for an appointment.     Signed, Sanda Klein, MD  06/25/2022 3:31 PM    [place]

## 2022-06-21 NOTE — Patient Instructions (Signed)
Medication Instructions:  The current medical regimen is effective;  continue present plan and medications.  *If you need a refill on your cardiac medications before your next appointment, please call your pharmacy*   Follow-Up: At [facility], you and your health needs are our priority.  As part of our continuing mission to provide you with exceptional heart care, we have created designated Provider Care Teams.  These Care Teams include your primary Cardiologist (physician) and Advanced Practice Providers (APPs -  Physician Assistants and Nurse Practitioners) who all work together to provide you with the care you need, when you need it.  We recommend signing up for the patient portal called "MyChart".  Sign up information is provided on this After Visit Summary.  MyChart is used to connect with patients for Virtual Visits (Telemedicine).  Patients are able to view lab/test results, encounter notes, upcoming appointments, etc.  Non-urgent messages can be sent to your provider as well.   To learn more about what you can do with MyChart, go to [email or web address].    Your next appointment:   As needed  The format for your next appointment:   In Person  Provider:   Sanda Klein, MD     Sent to pulmonology- they will call you for an appointment.

## 2022-06-25 ENCOUNTER — Encounter: Payer: Self-pay | Admitting: Cardiovascular Disease

## 2022-06-26 ENCOUNTER — Telehealth: Payer: Self-pay | Admitting: Orthopaedic Surgery

## 2022-06-26 NOTE — Telephone Encounter (Signed)
Patient states her shoulder is better and some ways its not. Please advise what to do next.

## 2022-06-30 ENCOUNTER — Other Ambulatory Visit: Payer: Self-pay | Admitting: Physician Assistant

## 2022-06-30 DIAGNOSIS — G8929 Other chronic pain: Secondary | ICD-10-CM

## 2022-07-07 ENCOUNTER — Ambulatory Visit (INDEPENDENT_AMBULATORY_CARE_PROVIDER_SITE_OTHER): Payer: Medicare Other | Admitting: Pulmonary Disease

## 2022-07-07 ENCOUNTER — Encounter (HOSPITAL_BASED_OUTPATIENT_CLINIC_OR_DEPARTMENT_OTHER): Payer: Self-pay | Admitting: Pulmonary Disease

## 2022-07-07 VITALS — BP 122/68 | HR 67 | Ht 60.0 in | Wt 126.6 lb

## 2022-07-07 DIAGNOSIS — R0602 Shortness of breath: Secondary | ICD-10-CM

## 2022-07-07 LAB — PULMONARY FUNCTION TEST
DL/VA % pred: 97 %
DL/VA: 4.17 ml/min/mmHg/L
DLCO cor % pred: 67 %
DLCO cor: 10.27 ml/min/mmHg
DLCO unc % pred: 67 %
DLCO unc: 10.27 ml/min/mmHg
FEF 25-75 Post: 1.41 L/sec
FEF 25-75 Pre: 0.54 L/sec
FEF2575-%Change-Post: 161 %
FEF2575-%Pred-Post: 124 %
FEF2575-%Pred-Pre: 47 %
FEV1-%Change-Post: 34 %
FEV1-%Pred-Post: 79 %
FEV1-%Pred-Pre: 58 %
FEV1-Post: 1.14 L
FEV1-Pre: 0.84 L
FEV1FVC-%Change-Post: -1 %
FEV1FVC-%Pred-Pre: 94 %
FEV6-%Change-Post: 36 %
FEV6-%Pred-Post: 89 %
FEV6-%Pred-Pre: 65 %
FEV6-Post: 1.65 L
FEV6-Pre: 1.2 L
FEV6FVC-%Pred-Post: 106 %
FEV6FVC-%Pred-Pre: 106 %
FVC-%Change-Post: 36 %
FVC-%Pred-Post: 84 %
FVC-%Pred-Pre: 61 %
FVC-Post: 1.65 L
FVC-Pre: 1.2 L
Post FEV1/FVC ratio: 69 %
Post FEV6/FVC ratio: 100 %
Pre FEV1/FVC ratio: 70 %
Pre FEV6/FVC Ratio: 100 %
RV % pred: 132 %
RV: 2.74 L
TLC % pred: 99 %
TLC: 4.13 L

## 2022-07-07 MED ORDER — IPRATROPIUM BROMIDE 0.06 % NA SOLN: 2.0000 | Freq: Every day | NASAL | 5 refills | Status: AC

## 2022-07-07 MED ORDER — BUDESONIDE-FORMOTEROL FUMARATE 160-4.5 MCG/ACT IN AERO: 2.0000 | INHALATION_SPRAY | Freq: Two times a day (BID) | RESPIRATORY_TRACT | 12 refills | Status: DC

## 2022-07-07 MED ORDER — ALBUTEROL SULFATE HFA 108 (90 BASE) MCG/ACT IN AERS: 2.0000 | INHALATION_SPRAY | Freq: Four times a day (QID) | RESPIRATORY_TRACT | 2 refills | Status: AC | PRN

## 2022-07-07 NOTE — Patient Instructions (Signed)
Full PFT Performed Today  

## 2022-07-07 NOTE — Progress Notes (Addendum)
Subjective:   PATIENT ID: Karina [name] GENDER: female DOB: [birth year], MRN: [id number]  Chief Complaint  Patient presents with   Consult    Crackles in lungs    Reason for Visit: New consult for shortness of breath  Ms. Karina [name] is a 81 year old female active smoker with HTN, HLD, postsurgical hypothyroidism, GERD, IBS, osteoporosis, interstitial cystitis, palpitations who presents for evaluation for shortness of breath and incidental CT chest findings.  She was recently evaluated by Cardiology and underwent echocardiogram (normal EF with mild diastolic function, coronary CT angio (no coronary stenosis, scattered aortic atherosclerosis) and heart monitor (PAC's and brief ectopic atrial tachycardia).  She has had productive cough with clear sputum in the last year associated with worsening allergies. Has chronic sinus R>L issues related to nasal injury from fall last year. Cough worse in the morning and evenings. Some nights are ok. Feels the nasal drainage. Has been using Flonase daily with some benefit. Reports reflux with her hiatal hernia and currently on prilosec. Sometimes has wheezing at night but not with exertion. She reports shortness of breath after returning to her mailbox. Her activity is limited due to hip problems.  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  Past Medical History:  Diagnosis Date   Arthritis    Chronic bladder pain    Chronic low back pain    Diverticulosis    Family history of adverse reaction to anesthesia    sister-N/V   Full dentures    GERD (gastroesophageal reflux disease)    Hiatal hernia    History of malignant melanoma 1978   per pt resection right calf area, no lymph node  removal, localized ,  had 3 rounds of chemo completed 1978;  pt states no recurrence   History of panic attacks    HTN (hypertension)    followed by pcp----  (nuclear stress test in epic 11-29-2009 normal   Hypercholesterolemia    Hypothyroidism, postsurgical    followed by pcp---  s/p  left thryoid lobectomy 1990s, no cancer   IC (interstitial cystitis)    urologist--- dr Jeffie Pollock   Osteoporosis    Wears glasses      History reviewed. No pertinent family history.   Social History   Occupational History   Not on file  Tobacco Use   Smoking status: Every Day    Packs/day: 0.50    Years: 67.00    Total pack years: 33.50    Types: Cigarettes    Last attempt to quit: 01/2020    Years since quitting: 2.4   Smokeless tobacco: Never  Vaping Use   Vaping Use: Never used  Substance and Sexual Activity   Alcohol use: No   Drug use: Never   Sexual activity: Not on file    Allergies  Allergen Reactions   Morphine Itching   Propoxyphene Hcl Itching     Outpatient Medications Prior to Visit  Medication Sig Dispense Refill   acetaminophen (TYLENOL) 500 MG tablet Take 500 mg by mouth every 6 (six) hours as needed.     ALPRAZolam (XANAX) 0.5 MG tablet Take 0.5 mg by mouth 2 (two) times daily.     aspirin 81 MG chewable tablet Chew 1 tablet (81 mg total) by mouth 2 (two) times  daily. (Patient taking differently: Chew 81 mg by mouth daily.)     Aspirin Buf,CaCarb-MgCarb-MgO, (BUFFERIN LOW DOSE PO) Take by mouth as needed.     atenolol (TENORMIN) 50 MG tablet Take 50 mg by mouth daily.     augmented betamethasone dipropionate (DIPROLENE-AF) 0.05 % cream Apply topically 2 (two) times daily.     calcium carbonate (OS-CAL) 600 MG TABS tablet Take 600 mg by mouth daily.     Coenzyme Q10 (CO Q 10) 100 MG CAPS Take 100 mg by mouth daily.     ergocalciferol (VITAMIN D2) 50000 units capsule Take 50,000 Units by mouth 2 (two) times a week.     levothyroxine (SYNTHROID) 88 MCG tablet Take 88 mcg  by mouth daily before breakfast.     Multiple Vitamin (MULTIVITAMIN) tablet Take 1 tablet by mouth daily.     olmesartan-hydrochlorothiazide (BENICAR HCT) 20-12.5 MG tablet Take 1 tablet by mouth daily.     omeprazole (PRILOSEC) 20 MG capsule Take 20 mg by mouth daily. Per pt and takes if needed in the evening     REPATHA SURECLICK 672 MG/ML SOAJ Inject 1 mL into the skin every 14 (fourteen) days.     risedronate (ACTONEL) 150 MG tablet Take 150 mg by mouth every 30 (thirty) days.     sertraline (ZOLOFT) 100 MG tablet Take 100 mg by mouth every morning.     Vitamin E 200 units TABS Take 200 Units by mouth daily.     celecoxib (CELEBREX) 200 MG capsule Take 200 mg by mouth daily. (Patient not taking: Reported on 07/07/2022)     Meth-Hyo-M Bl-Na Phos-Ph Sal (URIBEL) 118 MG CAPS Take 118 mg by mouth daily as needed (for bladder).  (Patient not taking: Reported on 06/21/2022)     ZETIA 10 MG tablet Take 10 mg by mouth at bedtime.  (Patient not taking: Reported on 07/07/2022)     No facility-administered medications prior to visit.    Review of Systems  Constitutional:  Negative for chills, diaphoresis, fever, malaise/fatigue and weight loss.  HENT:  Negative for congestion.   Respiratory:  Positive for cough, sputum production and shortness of breath. Negative for hemoptysis and wheezing.   Cardiovascular:  Negative for chest pain, palpitations and leg swelling.    Objective:   Vitals:   07/07/22 0906  BP: 122/68  Pulse: 67  SpO2: 94%  Weight: 126 lb 9.6 oz (57.4 kg)  Height: 5' (1.524 m)   SpO2: 94 % O2 Device: None (Room air)  Physical Exam: General: Well-appearing, no acute distress HENT: [place], AT Eyes: EOMI, no scleral icterus Respiratory: Right basilar crackle. No wheezing or rhonchi Cardiovascular: RRR, -M/R/G, no JVD Extremities:-Edema,-tenderness Neuro: AAO x4, CNII-XII grossly intact Psych: Normal mood, normal affect  Data Reviewed:  Imaging: CT Chest 03/24/22 - No  lymphadenopathy. RLL with minimal scarring  CT Coronary 05/16/22 - Visualized lung parenchyma with tiny LUL cluster pulmonary nodules in LUL. No masses, infiltrate, effusion or pneumothorax.  PFT: None on file  Labs: CBC    Component Value Date/Time   WBC 6.1 07/23/2020 1904   RBC 4.13 07/23/2020 1904   HGB 14.3 02/22/2021 1000   HCT 42.0 02/22/2021 1000   PLT 183 07/23/2020 1904   MCV 96.1 07/23/2020 1904   MCH 31.7 07/23/2020 1904   MCHC 33.0 07/23/2020 1904   RDW 13.3 07/23/2020 1904   LYMPHSABS 1.7 07/23/2020 1904   MONOABS 0.2 07/23/2020 1904   EOSABS 0.1 07/23/2020 1904   BASOSABS 0.1  07/23/2020 1904   Absolute eos 07/23/20 - 100     Assessment & Plan:   Discussion: 81 year old female active smoker with HTN, HLD, postsurgical hypothyroidism, GERD, IBS, osteoporosis, interstitial cystitis, palpitations who presents for evaluation for shortness of breath and incidental CT chest findings. Reviewed CT. Environmental exposures places her at risk for underlying pulmonary disease. Will evaluate with PFTs. Discussed SABA and possible need for maintenance inhalers pending PFT results. Discussed tobacco cessation.  Shortness of breath --START Albuterol AS NEEDED for shortness of breath or wheezing --ORDER PFTs  Addendum: Reviewed PFTs with significant BD response. Started on Symbicort 160   Abnormal CT Reviewed CT chest. Tiny subcentimeter nodules in LUL. Non-specific and does not warrant following.  Nasal congestion --CONTINUE Flonase  --ADD atrovent in the morning  Tobacco abuse Patient is an active smoker. We discussed smoking cessation for 5 minutes. We discussed triggers and stressors and ways to deal with them. We discussed barriers to continued smoking and benefits of smoking cessation. Provided patient with information cessation techniques and interventions including [place] quitline.   Health Maintenance Immunization History  Administered Date(s) Administered    Influenza, High Dose Seasonal PF 04/09/2018   PFIZER(Purple Top)SARS-COV-2 Vaccination 09/07/2019, 10/01/2019   CT Lung Screen - not qualified due to age  Orders Placed This Encounter  Procedures   Pulmonary function test    Standing Status:   Future    Standing Expiration Date:   07/08/2023    Order Specific Question:   Where should this test be performed?    Answer:   [facility]    Order Specific Question:   Full PFT: includes the following: basic spirometry, spirometry pre & post bronchodilator, diffusion capacity (DLCO), lung volumes    Answer:   Full PFT   Meds ordered this encounter  Medications   albuterol (VENTOLIN HFA) 108 (90 Base) MCG/ACT inhaler    Sig: Inhale 2 puffs into the lungs every 6 (six) hours as needed for wheezing or shortness of breath.    Dispense:  8 g    Refill:  2    Return in about 3 months (around 10/06/2022).  I have spent a total time of 45-minutes on the day of the appointment reviewing prior documentation, coordinating care and discussing medical diagnosis and plan with the patient/family. Imaging, labs and tests included in this note have been reviewed and interpreted independently by me.  [place], MD [facility] 07/07/2022 9:49 AM  Office Number [phone]

## 2022-07-07 NOTE — Progress Notes (Signed)
Full PFT Performed Today  

## 2022-07-07 NOTE — Patient Instructions (Addendum)
Shortness of breath --START Albuterol AS NEEDED for shortness of breath or wheezing --ORDER PFTs. I will call you with results  Abnormal CT Reviewed CT chest. Tiny subcentimeter nodules in LUL. Non-specific and does not warrant following.  Nasal congestion --CONTINUE Flonase  --ADD atrovent in the morning  Tobacco abuse Patient is an active smoker. We discussed smoking cessation for 5 minutes. We discussed triggers and stressors and ways to deal with them. We discussed barriers to continued smoking and benefits of smoking cessation. Provided patient with information cessation techniques and interventions including [place] quitline.  Follow-up with me in 3 months.

## 2022-07-07 NOTE — Addendum Note (Signed)
Addended by: Rodman Pickle on: 07/07/2022 03:13 PM   Modules accepted: Orders

## 2022-07-07 NOTE — Addendum Note (Signed)
Addended by: Rodman Pickle on: 07/07/2022 09:52 AM   Modules accepted: Orders

## 2022-07-31 ENCOUNTER — Institutional Professional Consult (permissible substitution): Payer: Medicare Other | Admitting: Internal Medicine

## 2022-08-08 ENCOUNTER — Ambulatory Visit
Admission: RE | Admit: 2022-08-08 | Discharge: 2022-08-08 | Disposition: A | Discharge status: Home / Self Care | Payer: Medicare Other | Source: Ambulatory Visit | Attending: Physician Assistant | Admitting: Physician Assistant

## 2022-08-08 DIAGNOSIS — G8929 Other chronic pain: Secondary | ICD-10-CM

## 2022-08-10 ENCOUNTER — Ambulatory Visit: Payer: Medicare Other | Admitting: Physician Assistant

## 2022-10-02 ENCOUNTER — Encounter (HOSPITAL_BASED_OUTPATIENT_CLINIC_OR_DEPARTMENT_OTHER): Payer: Self-pay | Admitting: Pulmonary Disease

## 2022-10-02 ENCOUNTER — Other Ambulatory Visit (HOSPITAL_COMMUNITY): Payer: Self-pay

## 2022-10-02 ENCOUNTER — Ambulatory Visit (HOSPITAL_BASED_OUTPATIENT_CLINIC_OR_DEPARTMENT_OTHER): Payer: Medicare Other | Admitting: Pulmonary Disease

## 2022-10-02 VITALS — BP 138/70 | HR 68 | Ht 60.0 in | Wt 130.4 lb

## 2022-10-02 DIAGNOSIS — J4489 Other specified chronic obstructive pulmonary disease: Secondary | ICD-10-CM | POA: Diagnosis not present

## 2022-10-02 MED ORDER — FLUTICASONE-SALMETEROL 115-21 MCG/ACT IN AERO: 2.0000 | INHALATION_SPRAY | Freq: Two times a day (BID) | RESPIRATORY_TRACT | 12 refills | Status: DC

## 2022-10-02 MED ORDER — BREZTRI AEROSPHERE 160-9-4.8 MCG/ACT IN AERO: 2.0000 | INHALATION_SPRAY | Freq: Two times a day (BID) | RESPIRATORY_TRACT | 0 refills | Status: AC

## 2022-10-02 NOTE — Patient Instructions (Addendum)
   COPD-asthma overlap --STOP Symbicort due to no insurance coverage --Provide Breztri inhaler samples until you can pick up your new inhalers --START Advair HFA 115 mcg TWO puffs in the morning and evening. OK to use as needed --CONTINUE Albuterol AS NEEDED for shortness of breath or wheezing  Follow-up with me in 1 year

## 2022-10-02 NOTE — Progress Notes (Signed)
Subjective:   PATIENT ID: Karina [name] GENDER: female DOB: [birth year], MRN: [name]  Chief Complaint  Patient presents with   Follow-up    * stopped smoking few weeks ago * Symbicort has a $500 copay and she'd like something else that works the same    Reason for Visit: Follow-up  Karina [name] is a 82 year old female active smoker with HTN, HLD, postsurgical hypothyroidism, GERD, IBS, osteoporosis, interstitial cystitis, palpitations who presents for evaluation for follow-up  Initial consult She was recently evaluated by Cardiology and underwent echocardiogram (normal EF with mild diastolic function, coronary CT angio (no coronary stenosis, scattered aortic atherosclerosis) and heart monitor (PAC's and brief ectopic atrial tachycardia).  She has had productive cough with clear sputum in the last year associated with worsening allergies. Has chronic sinus R>L issues related to nasal injury from fall last year. Cough worse in the morning and evenings. Some nights are ok. Feels the nasal drainage. Has been using Flonase daily with some benefit. Reports reflux with her hiatal hernia and currently on prilosec. Sometimes has wheezing at night but not with exertion. She reports shortness of breath after returning to her mailbox. Her activity is limited due to hip problems.  10/02/22 Since our last visit she has been intermittently using Symbicort and reports this is effective. She is using two puffs as need up to 3 times a week. Still has some nasal congestion but has been using flonase and atrovent which helps. Denies shortness of breath or wheezing. Continues to have productive cough. Friend encouraged her to be more compliant with her inhalers.  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 [employer]ile x >30 years Retired at 82 years old  Past Medical History:  Diagnosis Date    Arthritis    Chronic bladder pain    Chronic low back pain    Diverticulosis    Family history of adverse reaction to anesthesia    sister-N/V   Full dentures    GERD (gastroesophageal reflux disease)    Hiatal hernia    History of malignant melanoma 1978   per pt resection right calf area, no lymph node removal, localized ,  had 3 rounds of chemo completed 1978;  pt states no recurrence   History of panic attacks    HTN (hypertension)    followed by pcp----  (nuclear stress test in epic 11-29-2009 normal   Hypercholesterolemia    Hypothyroidism, postsurgical    followed by pcp---  s/p  left thryoid lobectomy 1990s, no cancer   IC (interstitial cystitis)    urologist--- dr Jeffie Pollock   Osteoporosis    Wears glasses      History reviewed. No pertinent family history.   Social History   Occupational History   Not on file  Tobacco Use   Smoking status: Every Day    Packs/day: 0.50    Years: 67.00    Total pack years: 33.50    Types: Cigarettes    Last attempt to quit: 01/2020    Years since quitting: 2.6   Smokeless tobacco: Never  Vaping Use   Vaping Use: Never used  Substance and Sexual Activity   Alcohol use: No   Drug use: Never   Sexual activity: Not on file    Allergies  Allergen Reactions   Morphine Itching   Propoxyphene Hcl Itching     Outpatient Medications Prior to Visit  Medication Sig Dispense Refill   acetaminophen (TYLENOL) 500 MG tablet Take 500 mg by mouth every 6 (six) hours as needed.     albuterol (VENTOLIN HFA) 108 (90 Base) MCG/ACT inhaler Inhale 2 puffs into the lungs every 6 (six) hours as needed for wheezing or shortness of breath. 8 g 2   ALPRAZolam (XANAX) 0.5 MG tablet Take 0.5 mg by mouth 2 (two) times daily.     aspirin 81 MG chewable tablet Chew 1 tablet (81 mg total) by mouth 2 (two) times daily. (Patient taking differently: Chew 81 mg by mouth daily.)     Aspirin Buf,CaCarb-MgCarb-MgO, (BUFFERIN LOW DOSE PO) Take by mouth as  needed.     atenolol (TENORMIN) 50 MG tablet Take 50 mg by mouth daily.     augmented betamethasone dipropionate (DIPROLENE-AF) 0.05 % cream Apply topically 2 (two) times daily.     budesonide-formoterol (SYMBICORT) 160-4.5 MCG/ACT inhaler Inhale 2 puffs into the lungs 2 (two) times daily. 1 each 12   calcium carbonate (OS-CAL) 600 MG TABS tablet Take 600 mg by mouth daily.     celecoxib (CELEBREX) 200 MG capsule Take 200 mg by mouth daily.     Coenzyme Q10 (CO Q 10) 100 MG CAPS Take 100 mg by mouth daily.     ergocalciferol (VITAMIN D2) 50000 units capsule Take 50,000 Units by mouth 2 (two) times a week.     ipratropium (ATROVENT) 0.06 % nasal spray Place 2 sprays into both nostrils daily. 15 mL 5   levothyroxine (SYNTHROID) 88 MCG tablet Take 88 mcg by mouth daily before breakfast.     Meth-Hyo-M Bl-Na Phos-Ph Sal (URIBEL) 118 MG CAPS Take 118 mg by mouth daily as needed (for bladder).     Multiple Vitamin (MULTIVITAMIN) tablet Take 1 tablet by mouth daily.     olmesartan-hydrochlorothiazide (BENICAR HCT) 20-12.5 MG tablet Take 1 tablet by mouth daily.     omeprazole (PRILOSEC) 20 MG capsule Take 20 mg by mouth daily. Per pt and takes if needed in the evening     REPATHA SURECLICK [id number] MG/ML SOAJ Inject 1 mL into the skin every 14 (fourteen) days.     risedronate (ACTONEL) 150 MG tablet Take 150 mg by mouth every 30 (thirty) days.     sertraline (ZOLOFT) 100 MG tablet Take 100 mg by mouth every morning.     Vitamin E 200 units TABS Take 200 Units by mouth daily.     ZETIA 10 MG tablet Take 10 mg by mouth at bedtime.     No facility-administered medications prior to visit.    Review of Systems  Constitutional:  Negative for chills, diaphoresis, fever, malaise/fatigue and weight loss.  HENT:  Positive for congestion.   Respiratory:  Negative for cough, hemoptysis, sputum production, shortness of breath and wheezing.   Cardiovascular:  Negative for chest pain, palpitations and leg swelling.     Objective:   Vitals:   10/02/22 1105  BP: 138/70  Pulse: 68  SpO2: 92%  Weight: 130 lb 6.4 oz (59.1 kg)  Height: 5' (1.524 m)   SpO2: 92 % O2 Device: None (Room air)  Physical Exam: General: Well-appearing, no acute distress HENT: [place], AT Eyes: EOMI, no scleral icterus Respiratory: Clear to auscultation bilaterally.  No crackles, wheezing or rales Cardiovascular: RRR, -M/R/G, no JVD Extremities:-Edema,-tenderness Neuro: AAO x4, CNII-XII grossly intact Psych: Normal mood, normal affect  Data Reviewed:  Imaging: CT Chest 03/24/22 - No lymphadenopathy. RLL with minimal scarring  CT Coronary  05/16/22 - Visualized lung parenchyma with tiny LUL cluster pulmonary nodules in LUL. No masses, infiltrate, effusion or pneumothorax.  PFT: 07/07/22 FVC 1.65 (84%) FEV1 1.14 (79%) Ratio 69  TLC 99% DLCO 67% Interpretation: Mild moderate obstructive defect with significant bronchodilator response with air trapping and mildly reduced DLCO   Labs: CBC    Component Value Date/Time   WBC 6.1 07/23/2020 1904   RBC 4.13 07/23/2020 1904   HGB 14.3 02/22/2021 1000   HCT 42.0 02/22/2021 1000   PLT 183 07/23/2020 1904   MCV 96.1 07/23/2020 1904   MCH 31.7 07/23/2020 1904   MCHC 33.0 07/23/2020 1904   RDW 13.3 07/23/2020 1904   LYMPHSABS 1.7 07/23/2020 1904   MONOABS 0.2 07/23/2020 1904   EOSABS 0.1 07/23/2020 1904   BASOSABS 0.1 07/23/2020 1904   Absolute eos 07/23/20 - 100     Assessment & Plan:   Discussion: 82 year old female active smoker with HTN, HLD, postsurgical hypothyroidism, GERD, IBS, osteoporosis, interstitial cystitis, palpitations who presents for COPD-asthma follow-up. Improved with PRN ICS/LABA. Persistent symptoms with chronic cough. Still actively smoking  COPD-asthma overlap --STOP Symbicort due to no insurance coverage --Provide Breztri inhaler samples. --START Advair HFA 115 mcg TWO puffs in the morning and evening. OK to use as needed --CONTINUE  Albuterol AS NEEDED for shortness of breath or wheezing  Abnormal CT Reviewed CT chest. Tiny subcentimeter nodules in LUL. Non-specific and does not warrant following.  Nasal congestion --CONTINUE Flonase  --CONTINUE atrovent in the morning  Tobacco abuse Patient is an active smoker. Did not counsel today  Health Maintenance Immunization History  Administered Date(s) Administered   Influenza, High Dose Seasonal PF 04/09/2018   PFIZER(Purple Top)SARS-COV-2 Vaccination 09/07/2019, 10/01/2019   CT Lung Screen - not qualified due to age  No orders of the defined types were placed in this encounter.  Meds ordered this encounter  Medications   Budeson-Glycopyrrol-Formoterol (BREZTRI AEROSPHERE) 160-9-4.8 MCG/ACT AERO    Sig: Inhale 2 puffs into the lungs in the morning and at bedtime.    Dispense:  5.9 g    Refill:  0    Order Specific Question:   Manufacturer?    Answer:   [employer] [[id number]]   fluticasone-salmeterol (ADVAIR HFA) 115-21 MCG/ACT inhaler    Sig: Inhale 2 puffs into the lungs 2 (two) times daily.    Dispense:  1 each    Refill:  12    Return in about 1 year (around 10/02/2023).  I have spent a total time of 35-minutes on the day of the appointment including chart review, data review, collecting history, coordinating care and discussing medical diagnosis and plan with the patient/family. Past medical history, allergies, medications were reviewed. Pertinent imaging, labs and tests included in this note have been reviewed and interpreted independently by me.  [place], MD [facility] 10/02/2022 4:04 PM  Office Number [phone]

## 2022-10-12 MED ORDER — FLUTICASONE-SALMETEROL 115-21 MCG/ACT IN AERO: 2.0000 | INHALATION_SPRAY | Freq: Two times a day (BID) | RESPIRATORY_TRACT | 12 refills | Status: AC

## 2022-10-12 NOTE — Addendum Note (Signed)
Addended by: Margaretha Seeds on: 10/12/2022 05:00 PM   Modules accepted: Orders

## 2023-06-26 IMAGING — MR MR SHOULDER*L* W/O CM
5 series · 34 of 40 positions shown · non-contrast
Comparison: X-ray shoulder 06/21/2021; MR humerus 01/22/2018.

CLINICAL DATA: Chronic left shoulder pain with decreased range of
motion. Clinical concern for rotator cuff tear.

EXAM:
MRI OF THE LEFT SHOULDER WITHOUT CONTRAST
TECHNIQUE: Multiplanar, multisequence MR imaging of the shoulder was performed.
No intravenous contrast was administered.

[Series 3: T2 fat-sat · axial · 4.0mm · 0.55mm/px · z∈[-37,+82]mm · 8 of 26 slices shown (1 of 3)]
[im 1/26]
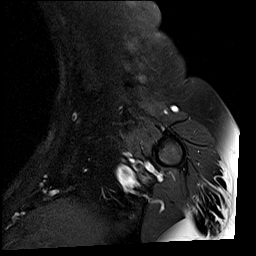
[im 3/26]
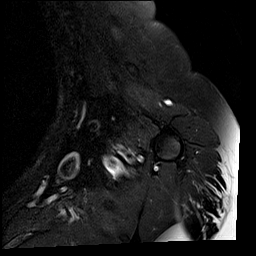
[im 9/26]
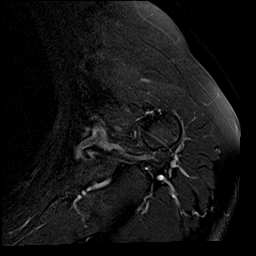
[im 12/26]
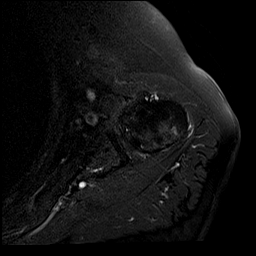
[im 14/26]
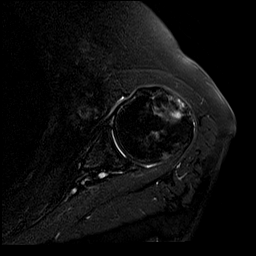
[im 17/26]
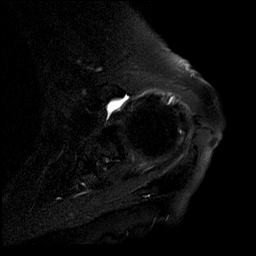
[im 23/26]
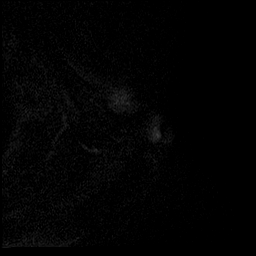
[im 26/26]
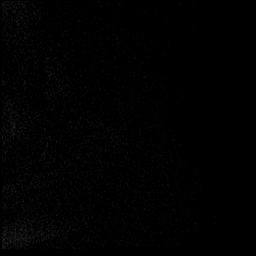

[Series 5: T2 fat-sat · oblique · 4.0mm · 0.55mm/px · 7 of 19 slices shown (2 of 3)]
[im 1/19]
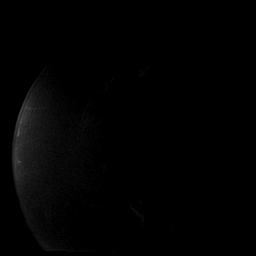
[im 4/19]
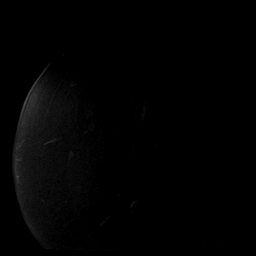
[im 7/19]
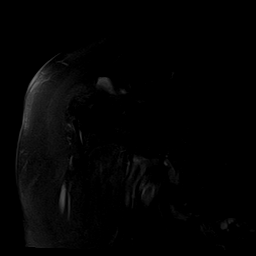
[im 10/19]
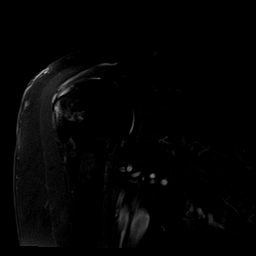
[im 13/19]
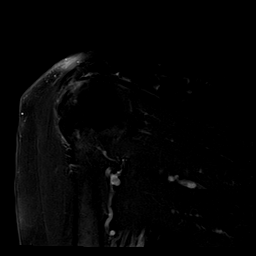
[im 16/19]
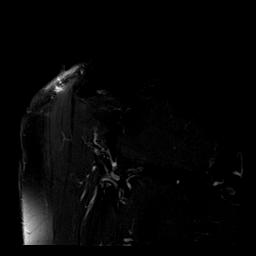
[im 19/19]
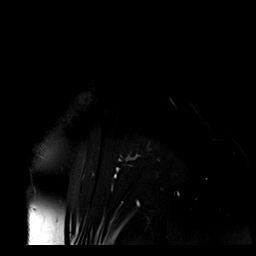

[Series 6: PD · oblique · 4.0mm · 0.27mm/px · 7 of 19 slices shown]
[im 1/19]
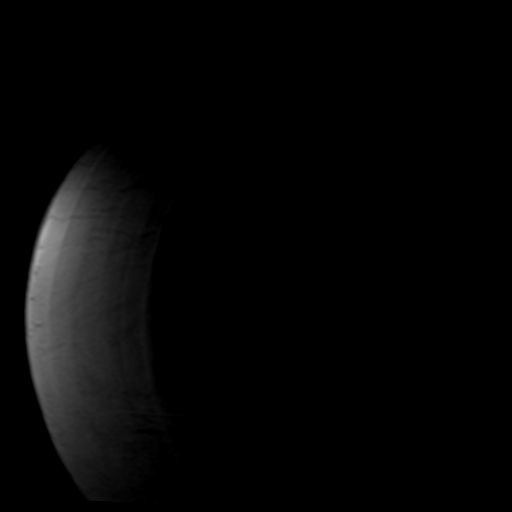
[im 4/19]
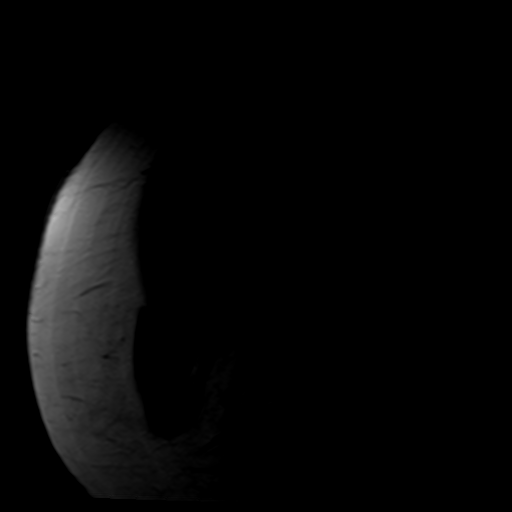
[im 7/19]
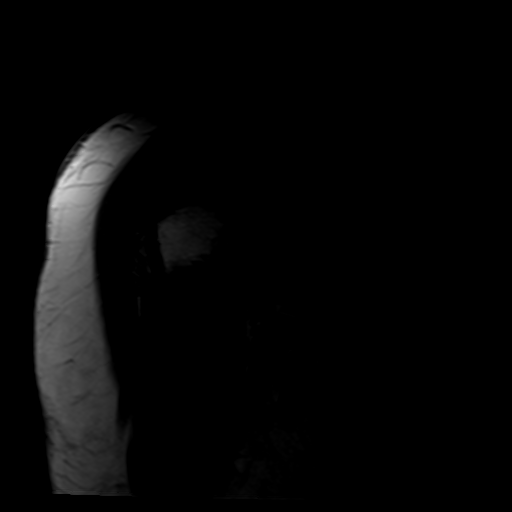
[im 10/19]
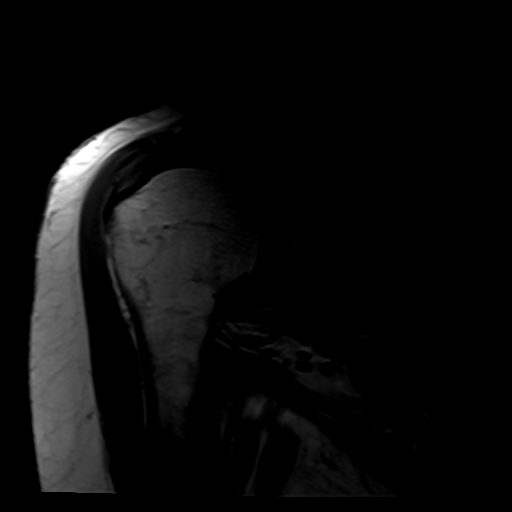
[im 13/19]
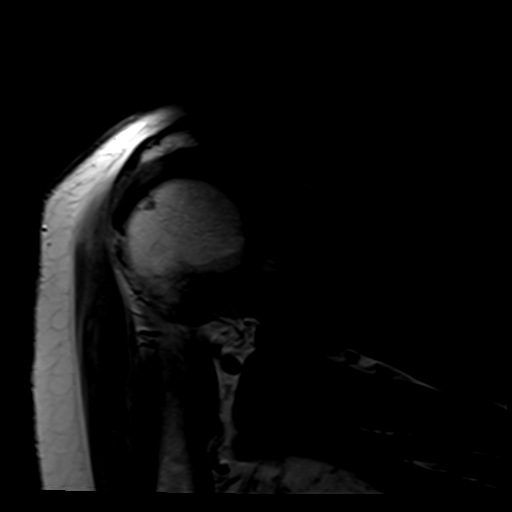
[im 16/19]
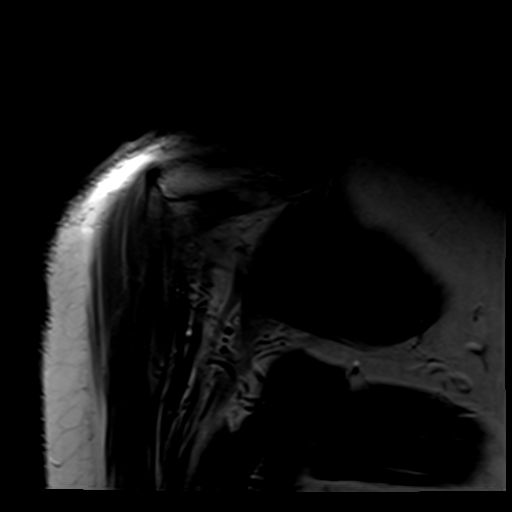
[im 19/19]
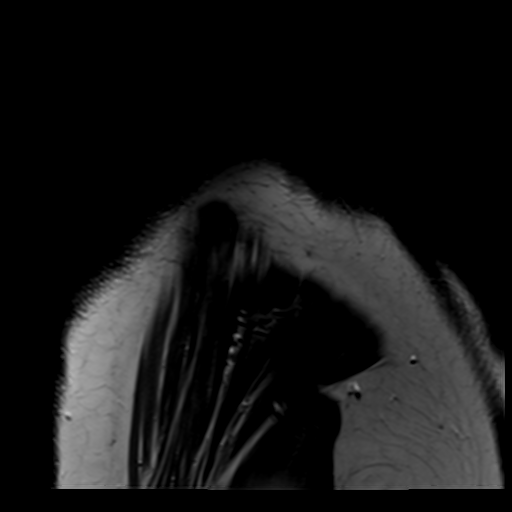

[Series 7: T2 fat-sat · coronal · 4.0mm · 0.55mm/px · 8 of 23 slices shown (3 of 3)]
[im 1/23]
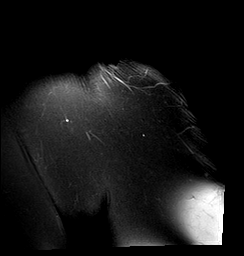
[im 4/23]
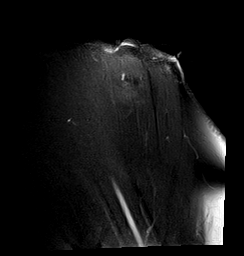
[im 7/23]
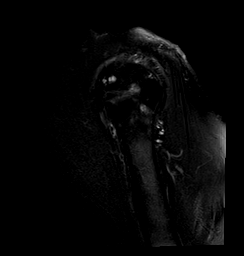
[im 10/23]
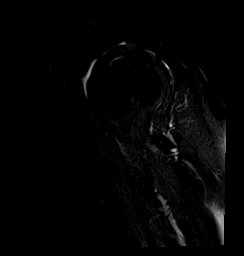
[im 13/23]
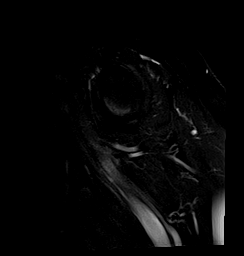
[im 16/23]
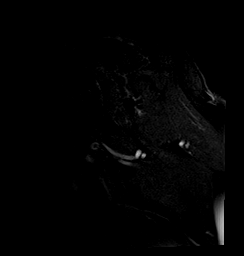
[im 19/23]
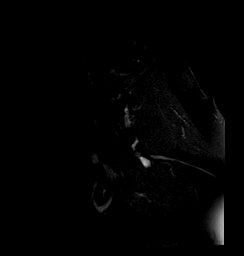
[im 23/23]
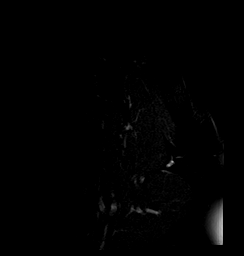

[Series 8: T1 · coronal · 4.0mm · 0.27mm/px · 4 of 23 slices shown]
[im 1/23]
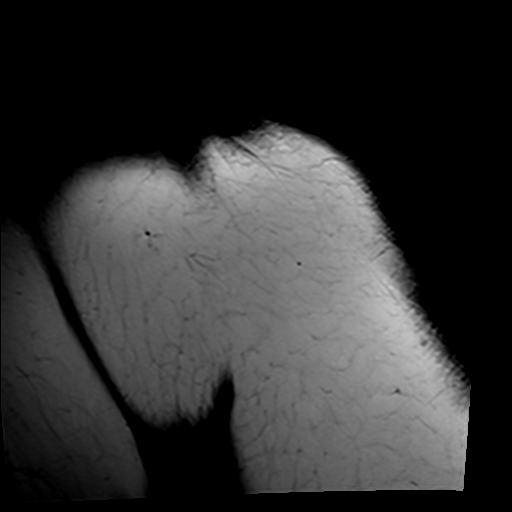
[im 4/23]
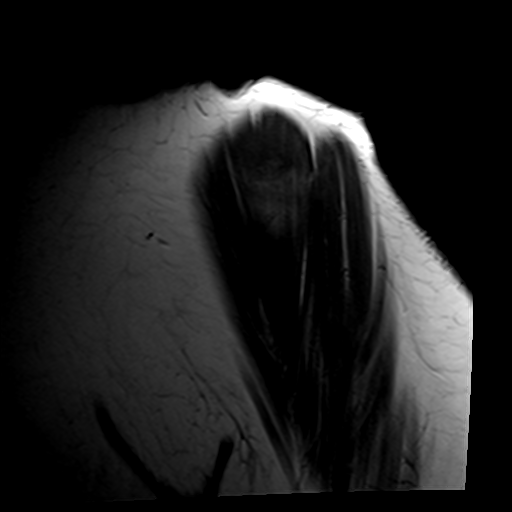
[im 7/23]
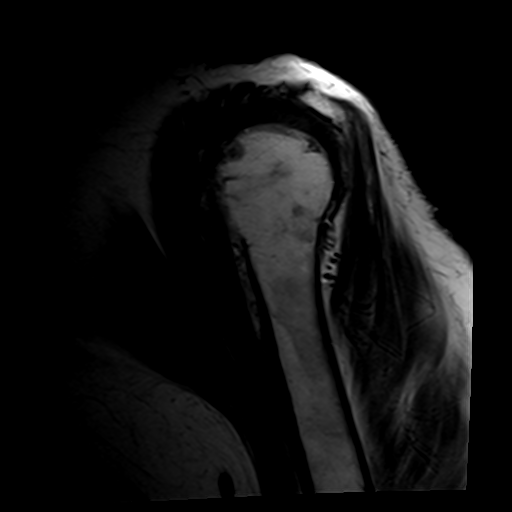
[im 10/23]
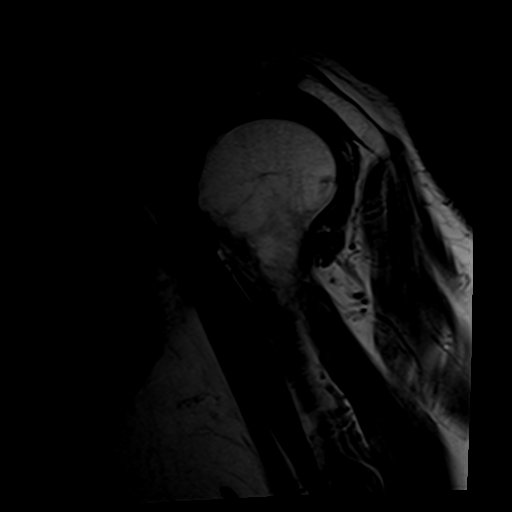

[34 of 40 positions shown; findings below may reference images not displayed]

FINDINGS: Rotator cuff: Mild rotator cuff tendinosis. Focal high-grade tear
involving the mid to posterior aspect of the supraspinatus tendon
insertion which appears essentially full-thickness (series 5, image
10). No retraction. Tear measures 4 mm in AP dimension. High-grade
partial-thickness articular surface tear of the anterior
infraspinatus tendon in the region of the critical zone (series 5,
image 8). Subscapularis tendon is attenuated but appears otherwise
intact. Teres minor unremarkable.

Muscles: Subscapularis muscle is atrophic. Remaining rotator cuff
musculature demonstrates preserved bulk and signal intensity.

Biceps long head: Intra-articular portion of the long head biceps
tendon is not definitively visualized, and may be torn.

Acromioclavicular Joint: Mild arthropathy of the AC joint. Small
volume subacromial subdeltoid bursal fluid.

Glenohumeral Joint: Small humeral head marginal osteophytes. No
focal cartilage defect. No significant joint effusion.

Labrum: Grossly intact although evaluation is limited in the absence
of intra-articular fluid. No paralabral cyst.

Bones: Reactive subcortical marrow signal changes at the greater
tuberosity. No fracture. No dislocation. No suspicious bone lesion.

Other: Known lateral triceps lipoma is only partially imaged at the
edge of the field of view.
IMPRESSION: 1. Mild rotator cuff tendinosis with focal high-grade tear involving
the mid to posterior aspect of the supraspinatus tendon insertion
which appears essentially full-thickness. High-grade
partial-thickness articular surface tear of the anterior
infraspinatus tendon in the region of the critical zone.
2. Intra-articular portion of the long head biceps tendon is not
definitively visualized, and may be torn.
3. Mild subacromial subdeltoid bursitis.
4. Mild AC joint arthropathy.

## 2023-09-11 ENCOUNTER — Encounter: Payer: Self-pay | Admitting: Cardiovascular Disease

## 2023-09-11 ENCOUNTER — Ambulatory Visit: Payer: Medicare Other | Attending: Cardiovascular Disease | Admitting: Cardiovascular Disease

## 2023-09-11 VITALS — BP 128/84 | HR 59 | Ht 58.66 in | Wt 129.2 lb

## 2023-09-11 DIAGNOSIS — R002 Palpitations: Secondary | ICD-10-CM

## 2023-09-11 DIAGNOSIS — F17200 Nicotine dependence, unspecified, uncomplicated: Secondary | ICD-10-CM

## 2023-09-11 DIAGNOSIS — J432 Centrilobular emphysema: Secondary | ICD-10-CM | POA: Diagnosis not present

## 2023-09-11 NOTE — Progress Notes (Unsigned)
 Cardiology Office Note:    Date:  09/12/2023   ID:  Matthew Folks, DOB 13-Aug-1940, MRN [id number]  PCP:  Chilton Greathouse, MD   [facility] Providers Cardiologist:  Thurmon Fair, MD     Referring MD: Chilton Greathouse, MD   No chief complaint on file.   History of Present Illness:    Karina [name] is a 83 y.o. female with a hx of hypertension, hypercholesterolemia, postsurgical hypothyroidism, GERD, irritable bowel syndrome, osteoporosis, interstitial cystitis, Aortic atherosclerosis, history of statin myopathy, COPD, ongoing light smoker.  She was initially referred for shortness of breath and evidence of coronary artery calcification, but her coronary artery CT angio did not show any evidence of coronary stenosis and her coronary calcium score was actually very low at only 1 (15th percentile).  She also had complaints of palpitations and her event monitor showed that she had PACs and brief bursts of ectopic atrial tachycardia, but no atrial fibrillation or other serious arrhythmia.  Her echocardiogram shows normal left ventricular systolic function.  Generally feels about the same.  Occasionally has shortness of breath when she walks her dog.  Has had to switch inhalers due to insurance coverage and cost issues, but these have generally helped with her shortness of breath and wheezing.  She is trying to quit smoking, but only smokes about a couple of cigarettes a day (but has a 50-pack-year history of smoking before that).  She has not had any problems with claudication or focal neurological events.  She denies chest pain at rest or with activity.  She has not had syncope.  She has good blood pressure control on the current medications.  Her most recent lipid profile was favorable with an HDL of 68 and LDL 74.  She does not have diabetes mellitus.  Pulmonary function test performed December 2023 showed an FEV1 that was 58% of predicted (0.8 L).  Normal total lung capacity with  increased residual volume.  Past Medical History:  Diagnosis Date   Arthritis    Chronic bladder pain    Chronic low back pain    Diverticulosis    Family history of adverse reaction to anesthesia    sister-N/V   Full dentures    GERD (gastroesophageal reflux disease)    Hiatal hernia    History of malignant melanoma 1978   per pt resection right calf area, no lymph node removal, localized ,  had 3 rounds of chemo completed 1978;  pt states no recurrence   History of panic attacks    HTN (hypertension)    followed by pcp----  (nuclear stress test in epic 11-29-2009 normal   Hypercholesterolemia    Hypothyroidism, postsurgical    followed by pcp---  s/p  left thryoid lobectomy 1990s, no cancer   IC (interstitial cystitis)    urologist--- dr Annabell Howells   Osteoporosis    Wears glasses     Past Surgical History:  Procedure Laterality Date   APPENDECTOMY  1957   CATARACT EXTRACTION W/ INTRAOCULAR LENS IMPLANT Bilateral 2020   CYSTOSCOPY WITH URETHRAL DILATATION N/A 02/22/2021   Procedure: CYSTOSCOPY WITH URETHRAL DILATATION HYDRODISTENTION OF BLADDER AND INSTILLATION OF MARICARE AND PYRIDIUM;  Surgeon: Bjorn Pippin, MD;  Location: [facility];  Service: Urology;  Laterality: N/A;  30 MINS FOR CASE   MOHS SURGERY  2014   approx---  (left forehead)   ORIF WRIST FRACTURE Left 2018   OVARIAN CYST REMOVAL     laparatomy x2  last one age 76  SHOULDER ARTHROSCOPY Left 2004   @[facility]    SHOULDER OPEN ROTATOR CUFF REPAIR Right 2006   THYROIDECTOMY, PARTIAL Left    1990s   TONSILLECTOMY     child   TOTAL ABDOMINAL HYSTERECTOMY W/ BILATERAL SALPINGOOPHORECTOMY  1971   PER PT TOLD REMNANT OF ONE OVARY RETAINED   TOTAL HIP ARTHROPLASTY Right 05/07/2018   Procedure: RIGHT TOTAL HIP ARTHROPLASTY;  Surgeon: Valeria Batman, MD;  Location: [facility];  Service: Orthopedics;  Laterality: Right;    Current Medications: Current Meds  Medication Sig   acetaminophen (TYLENOL) 500 MG  tablet Take 500 mg by mouth every 6 (six) hours as needed.   albuterol (VENTOLIN HFA) 108 (90 Base) MCG/ACT inhaler Inhale 2 puffs into the lungs every 6 (six) hours as needed for wheezing or shortness of breath.   ALPRAZolam (XANAX) 0.5 MG tablet Take 0.5 mg by mouth 2 (two) times daily.   aspirin 81 MG chewable tablet Chew 1 tablet (81 mg total) by mouth 2 (two) times daily. (Patient taking differently: Chew 81 mg by mouth daily.)   Aspirin Buf,CaCarb-MgCarb-MgO, (BUFFERIN LOW DOSE PO) Take by mouth as needed.   atenolol (TENORMIN) 50 MG tablet Take 50 mg by mouth daily.   augmented betamethasone dipropionate (DIPROLENE-AF) 0.05 % cream Apply topically 2 (two) times daily.   Budeson-Glycopyrrol-Formoterol (BREZTRI AEROSPHERE) 160-9-4.8 MCG/ACT AERO Inhale 2 puffs into the lungs in the morning and at bedtime.   calcium carbonate (OS-CAL) 600 MG TABS tablet Take 600 mg by mouth daily.   celecoxib (CELEBREX) 200 MG capsule Take 200 mg by mouth daily.   Coenzyme Q10 (CO Q 10) 100 MG CAPS Take 100 mg by mouth daily.   ergocalciferol (VITAMIN D2) 50000 units capsule Take 50,000 Units by mouth 2 (two) times a week.   fluticasone-salmeterol (ADVAIR HFA) 115-21 MCG/ACT inhaler Inhale 2 puffs into the lungs 2 (two) times daily.   ipratropium (ATROVENT) 0.06 % nasal spray Place 2 sprays into both nostrils daily.   levothyroxine (SYNTHROID) 88 MCG tablet Take 88 mcg by mouth daily before breakfast.   Meth-Hyo-M Bl-Na Phos-Ph Sal (URIBEL) 118 MG CAPS Take 118 mg by mouth daily as needed (for bladder).   Multiple Vitamin (MULTIVITAMIN) tablet Take 1 tablet by mouth daily.   olmesartan-hydrochlorothiazide (BENICAR HCT) 20-12.5 MG tablet Take 1 tablet by mouth daily.   omeprazole (PRILOSEC) 20 MG capsule Take 20 mg by mouth daily. Per pt and takes if needed in the evening   REPATHA SURECLICK 140 MG/ML SOAJ Inject 1 mL into the skin every 14 (fourteen) days.   risedronate (ACTONEL) 150 MG tablet Take 150 mg by  mouth every 30 (thirty) days.   sertraline (ZOLOFT) 100 MG tablet Take 100 mg by mouth every morning.   Vitamin E 200 units TABS Take 200 Units by mouth daily.   ZETIA 10 MG tablet Take 10 mg by mouth at bedtime.     Allergies:   Morphine and Propoxyphene hcl   Social History   Socioeconomic History   Marital status: Single    Spouse name: Not on file   Number of children: Not on file   Years of education: Not on file   Highest education level: Not on file  Occupational History   Not on file  Tobacco Use   Smoking status: Every Day    Current packs/day: 0.00    Average packs/day: 0.5 packs/day for 67.0 years (33.5 ttl pk-yrs)    Types: Cigarettes    Start date: 01/1953  Last attempt to quit: 01/2020    Years since quitting: 3.6   Smokeless tobacco: Never  Vaping Use   Vaping status: Never Used  Substance and Sexual Activity   Alcohol use: No   Drug use: Never   Sexual activity: Not on file  Other Topics Concern   Not on file  Social History Narrative   Not on file   Social Drivers of Health   Financial Resource Strain: Not on file  Food Insecurity: Not on file  Transportation Needs: Not on file  Physical Activity: Not on file  Stress: Not on file  Social Connections: Not on file     Family History:  significant for mitral valve prolapse and left bundle branch block in her mother, syncope and pacemaker implantation in her older sister.  ROS:   Please see the history of present illness.     All other systems reviewed and are negative.  EKGs/Labs/Other Studies Reviewed:    The following studies were reviewed today: Notes and labs from primary care provider  Coronary CT angiogram 05/16/2022 IMPRESSION: 1.  Minimal nonobstructive CAD, CADRADS = 1. 2. Coronary calcium score of 1. This was 15 percentile for age and sex matched control. 3. Normal coronary origin with right dominance. 4.  Scattered aortic atherosclerosis.   1. Tiny clustered left upper  lobe pulmonary nodules measure up to 3 mm, favored to reflect an infectious or inflammatory process. No follow-up needed if patient is low-risk (and has no known or suspected primary neoplasm). Non-contrast chest CT can be considered in 12 months if patient is high-risk. This recommendation follows the consensus statement: Guidelines for Management of Incidental Pulmonary Nodules Detected on CT Images: From the Fleischner Society 2017; Radiology 2017; 284:228-243. 2. Small hiatal hernia. 3. Mild diffuse bronchial wall thickening, which can be seen in the setting of bronchitis. 4.  Aortic Atherosclerosis (ICD10-I70.0).   Echocardiogram 05/16/2022   1. Left ventricular ejection fraction by 3D volume is 55 %. The left  ventricle has normal function. The left ventricle has no regional wall  motion abnormalities. Left ventricular diastolic parameters are consistent  with Grade I diastolic dysfunction  (impaired relaxation).   2. Right ventricular systolic function is normal. The right ventricular  size is normal.   3. The mitral valve is normal in structure. Trivial mitral valve  regurgitation. No evidence of mitral stenosis.   4. The aortic valve is tricuspid. There is mild calcification of the  aortic valve. There is mild thickening of the aortic valve. Aortic valve  regurgitation is mild. Aortic valve sclerosis is present, with no evidence  of aortic valve stenosis.   5. The inferior vena cava is normal in size with greater than 50%  respiratory variability, suggesting right atrial pressure of 3 mmHg.   Event monitor 05/22/2022    The dominant rhythm is normal sinus rhythm with normal circadian variation.   There are frequent but very brief episodes of ectopic atrial tachycardia, the longest being only 22 seconds in duration.  There is no evidence of atrial fibrillation.   There is no evidence of severe bradycardia or complex ventricular arrhythmia.   Abnormal event monitor due to  occasional episodes of brief ectopic atrial tachycardia.  Atrial fibrillation is not seen.  EKG:    EKG Interpretation Date/Time:  Tuesday September 11 2023 14:43:55 EST Ventricular Rate:  59 PR Interval:  144 QRS Duration:  68 QT Interval:  422 QTC Calculation: 417 R Axis:   -17  Text Interpretation:  Sinus bradycardia Nonspecific ST and T wave abnormality When compared with ECG of 23-Jul-2020 20:20, No significant change was found Confirmed by Dade Rodin [phone]) on 09/11/2023 5:18:03 PM         Recent Labs: No results found for requested labs within last 365 days.  02/17/2022 Glucose 82, normal liver function test, creatinine 0.7, potassium 3.6, hemoglobin 14.1, TSH 3.54, free T4 1.2 03/23/2023 Potassium 3.9, ALT 30, TSH 2.69  Recent Lipid Panel No results found for: "CHOL", "TRIG", "HDL", "CHOLHDL", "VLDL", "LDLCALC", "LDLDIRECT" 02/17/2022 Cholesterol 148, triglycerides 130, HDL 76, LDL 46 03/23/2023 Cholesterol 169, triglycerides 137, HDL 68, LDL 74  Risk Assessment/Calculations:          Physical Exam:    VS:  BP 128/84 (BP Location: Left Arm, Patient Position: Sitting, Cuff Size: Normal)   Pulse (!) 59   Ht 4' 10.66" (1.49 m)   Wt 129 lb 3.2 oz (58.6 kg)   SpO2 94%   BMI 26.40 kg/m     Wt Readings from Last 3 Encounters:  09/11/23 129 lb 3.2 oz (58.6 kg)  10/02/22 130 lb 6.4 oz (59.1 kg)  07/07/22 126 lb 9.6 oz (57.4 kg)     General: Alert, oriented x3, no distress Head: no evidence of trauma, PERRL, EOMI, no exophtalmos or lid lag, no myxedema, no xanthelasma; normal ears, nose and oropharynx Neck: normal jugular venous pulsations and no hepatojugular reflux; brisk carotid pulses without delay and no carotid bruits Chest: She has a Velcro-like crackles in both posterior lung bases Cardiovascular: normal position and quality of the apical impulse, regular rhythm, normal first and second heart sounds, no murmurs, rubs or gallops Abdomen: no tenderness  or distention, no masses by palpation, no abnormal pulsatility or arterial bruits, normal bowel sounds, no hepatosplenomegaly Extremities: no clubbing, cyanosis or edema; 2+ radial, ulnar and brachial pulses bilaterally; 2+ right femoral, posterior tibial and dorsalis pedis pulses; 2+ left femoral, posterior tibial and dorsalis pedis pulses; no subclavian or femoral bruits Neurological: grossly nonfocal Psych: Normal mood and affect    ASSESSMENT:    1. Palpitations   2. Smoking   3. Centrilobular emphysema (HCC)     PLAN:    In order of problems listed above:   Chest pain: Not bothering her recently.  Has always been atypical.  The coronary CT angiogram shows no evidence of coronary stenosis and she has a very low coronary calcium score which is a good prognostic indicator. Palpitations: These have not troubled her much.  She has PACs and brief bursts of ectopic atrial tachycardia that is not sustained.  She does not have atrial fibrillation.  We reviewed the fact that her bronchodilators can increase the occurrence of these arrhythmic events. Smoking: Only smokes a couple of cigarettes a day, but I strongly encouraged her to quit completely. COPD: Followed in the pulmonary clinic by Dr. Everardo All.  Her dyspnea seems to be primarily due to this disorder.      Medication Adjustments/Labs and Tests Ordered: Current medicines are reviewed at length with the patient today.  Concerns regarding medicines are outlined above.  Orders Placed This Encounter  Procedures   EKG 12-Lead   No orders of the defined types were placed in this encounter.   Patient Instructions  Medication Instructions:  Your physician recommends that you continue on your current medications as directed. Please refer to the Current Medication list given to you today.    *If you need a refill on your cardiac medications before your  next appointment, please call your pharmacy*   Lab Work: None    If you have  labs (blood work) drawn today and your tests are completely normal, you will receive your results only by: MyChart Message (if you have MyChart) OR A paper copy in the mail If you have any lab test that is abnormal or we need to change your treatment, we will call you to review the results.   Testing/Procedures: None    Follow-Up: At [facility], you and your health needs are our priority.  As part of our continuing mission to provide you with exceptional heart care, we have created designated Provider Care Teams.  These Care Teams include your primary Cardiologist (physician) and Advanced Practice Providers (APPs -  Physician Assistants and Nurse Practitioners) who all work together to provide you with the care you need, when you need it.  We recommend signing up for the patient portal called "MyChart".  Sign up information is provided on this After Visit Summary.  MyChart is used to connect with patients for Virtual Visits (Telemedicine).  Patients are able to view lab/test results, encounter notes, upcoming appointments, etc.  Non-urgent messages can be sent to your provider as well.   To learn more about what you can do with MyChart, go to [email or web address].    Your next appointment:   1 year(s)  The format for your next appointment:   In Person  Provider:   Thurmon Fair, MD    Other Instructions    Signed, Thurmon Fair, MD  09/12/2023 4:47 PM    [facility]

## 2023-09-11 NOTE — Patient Instructions (Signed)
 Medication Instructions:  Your physician recommends that you continue on your current medications as directed. Please refer to the Current Medication list given to you today.    *If you need a refill on your cardiac medications before your next appointment, please call your pharmacy*   Lab Work: None    If you have labs (blood work) drawn today and your tests are completely normal, you will receive your results only by: MyChart Message (if you have MyChart) OR A paper copy in the mail If you have any lab test that is abnormal or we need to change your treatment, we will call you to review the results.   Testing/Procedures: None    Follow-Up: At [facility], you and your health needs are our priority.  As part of our continuing mission to provide you with exceptional heart care, we have created designated Provider Care Teams.  These Care Teams include your primary Cardiologist (physician) and Advanced Practice Providers (APPs -  Physician Assistants and Nurse Practitioners) who all work together to provide you with the care you need, when you need it.  We recommend signing up for the patient portal called "MyChart".  Sign up information is provided on this After Visit Summary.  MyChart is used to connect with patients for Virtual Visits (Telemedicine).  Patients are able to view lab/test results, encounter notes, upcoming appointments, etc.  Non-urgent messages can be sent to your provider as well.   To learn more about what you can do with MyChart, go to [email or web address].    Your next appointment:   1 year(s)  The format for your next appointment:   In Person  Provider:   Thurmon Fair, MD    Other Instructions

## 2023-09-12 DIAGNOSIS — J449 Chronic obstructive pulmonary disease, unspecified: Secondary | ICD-10-CM | POA: Insufficient documentation

## 2023-10-29 ENCOUNTER — Other Ambulatory Visit (HOSPITAL_BASED_OUTPATIENT_CLINIC_OR_DEPARTMENT_OTHER): Payer: Self-pay

## 2023-10-29 ENCOUNTER — Encounter (HOSPITAL_BASED_OUTPATIENT_CLINIC_OR_DEPARTMENT_OTHER): Payer: Self-pay | Admitting: Pulmonary Disease

## 2023-10-29 ENCOUNTER — Ambulatory Visit (HOSPITAL_BASED_OUTPATIENT_CLINIC_OR_DEPARTMENT_OTHER): Payer: Medicare Other | Admitting: Pulmonary Disease

## 2023-10-29 VITALS — BP 110/62 | HR 66 | Ht 58.66 in | Wt 127.9 lb

## 2023-10-29 DIAGNOSIS — J4489 Other specified chronic obstructive pulmonary disease: Secondary | ICD-10-CM | POA: Diagnosis not present

## 2023-10-29 MED ORDER — SYMBICORT 160-4.5 MCG/ACT IN AERO: 2.0000 | INHALATION_SPRAY | Freq: Every day | RESPIRATORY_TRACT | 12 refills | Status: AC

## 2023-10-29 NOTE — Patient Instructions (Addendum)
  COPD-asthma overlap --STOP Advair HFA 115 mcg due to insurance coverage --START Symbicort 160-4.5 TWO puffs in the morning and evening --CONTINUE Albuterol AS NEEDED for shortness of breath or wheezing

## 2023-10-29 NOTE — Progress Notes (Signed)
 Subjective:   PATIENT ID: Karina [name] GENDER: female DOB: [birth year], MRN: [id number]  Chief Complaint  Patient presents with   Follow-up    Annual follow-up    Reason for Visit: Follow-up  Karina [name] is a 83 year old female active smoker with HTN, HLD, postsurgical hypothyroidism, GERD, IBS, osteoporosis, interstitial cystitis, palpitations who presents for evaluation for follow-up  Initial consult She was recently evaluated by Cardiology and underwent echocardiogram (normal EF with mild diastolic function, coronary CT angio (no coronary stenosis, scattered aortic atherosclerosis) and heart monitor (PAC's and brief ectopic atrial tachycardia).  She has had productive cough with clear sputum in the last year associated with worsening allergies. Has chronic sinus R>L issues related to nasal injury from fall last year. Cough worse in the morning and evenings. Some nights are ok. Feels the nasal drainage. Has been using Flonase daily with some benefit. Reports reflux with her hiatal hernia and currently on prilosec. Sometimes has wheezing at night but not with exertion. She reports shortness of breath after returning to her mailbox. Her activity is limited due to hip problems.  10/02/22 Since our last visit she has been intermittently using Symbicort and reports this is effective. She is using two puffs as need up to 3 times a week. Still has some nasal congestion but has been using flonase and atrovent which helps. Denies shortness of breath or wheezing. Continues to have productive cough. Friend encouraged her to be more compliant with her inhalers.  10/29/23 Since our last visit Advair HFA helps her shortness of breath, cough and wheezing. Pollen will trigger her symptoms. Will sometimes have mild productive cough due to allergies. Taking generic claritin for allergies. Quit smoking a few months ago. 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 Retired at 83 years old  Past Medical History:  Diagnosis Date   Arthritis    Chronic bladder pain    Chronic low back pain    Diverticulosis    Family history of adverse reaction to anesthesia    sister-N/V   Full dentures    GERD (gastroesophageal reflux disease)    Hiatal hernia    History of malignant melanoma 1978   per pt resection right calf area, no lymph node removal, localized ,  had 3 rounds of chemo completed 1978;  pt states no recurrence   History of panic attacks    HTN (hypertension)    followed by pcp----  (nuclear stress test in epic 11-29-2009 normal   Hypercholesterolemia    Hypothyroidism, postsurgical    followed by pcp---  s/p  left thryoid lobectomy 1990s, no cancer   IC (interstitial cystitis)    urologist--- dr Annabell Howells   Osteoporosis    Wears glasses      History reviewed. No pertinent family history.   Social History   Occupational History   Not on file  Tobacco Use   Smoking status: Every Day    Current packs/day: 0.00    Average packs/day: 0.5 packs/day for 67.0 years (33.5 ttl pk-yrs)    Types: Cigarettes    Start date: 01/1953    Last attempt to quit: 01/2020    Years since quitting: 3.7   Smokeless tobacco: Never  Vaping Use   Vaping status: Never Used  Substance and Sexual Activity   Alcohol use: No  Drug use: Never   Sexual activity: Not on file    Allergies  Allergen Reactions   Morphine Itching   Propoxyphene Hcl Itching     Outpatient Medications Prior to Visit  Medication Sig Dispense Refill   acetaminophen (TYLENOL) 500 MG tablet Take 500 mg by mouth every 6 (six) hours as needed.     albuterol (VENTOLIN HFA) 108 (90 Base) MCG/ACT inhaler Inhale 2 puffs into the lungs every 6 (six) hours as needed for wheezing or shortness of breath. 8 g 2   ALPRAZolam (XANAX) 0.5 MG tablet Take 0.5 mg by  mouth 2 (two) times daily.     aspirin 81 MG chewable tablet Chew 1 tablet (81 mg total) by mouth 2 (two) times daily. (Patient taking differently: Chew 81 mg by mouth daily.)     Aspirin Buf,CaCarb-MgCarb-MgO, (BUFFERIN LOW DOSE PO) Take by mouth as needed.     atenolol (TENORMIN) 50 MG tablet Take 50 mg by mouth daily.     augmented betamethasone dipropionate (DIPROLENE-AF) 0.05 % cream Apply topically 2 (two) times daily.     Budeson-Glycopyrrol-Formoterol (BREZTRI AEROSPHERE) 160-9-4.8 MCG/ACT AERO Inhale 2 puffs into the lungs in the morning and at bedtime. 5.9 g 0   calcium carbonate (OS-CAL) 600 MG TABS tablet Take 600 mg by mouth daily.     celecoxib (CELEBREX) 200 MG capsule Take 200 mg by mouth daily.     Coenzyme Q10 (CO Q 10) 100 MG CAPS Take 100 mg by mouth daily.     ergocalciferol (VITAMIN D2) 50000 units capsule Take 50,000 Units by mouth 2 (two) times a week.     fluticasone (FLONASE) 50 MCG/ACT nasal spray Place 2 sprays into both nostrils daily.     fluticasone-salmeterol (ADVAIR HFA) 115-21 MCG/ACT inhaler Inhale 2 puffs into the lungs 2 (two) times daily. 1 each 12   ipratropium (ATROVENT) 0.06 % nasal spray Place 2 sprays into both nostrils daily. 15 mL 5   levothyroxine (SYNTHROID) 88 MCG tablet Take 88 mcg by mouth daily before breakfast.     Meth-Hyo-M Bl-Na Phos-Ph Sal (URIBEL) 118 MG CAPS Take 118 mg by mouth daily as needed (for bladder).     Multiple Vitamin (MULTIVITAMIN) tablet Take 1 tablet by mouth daily.     olmesartan-hydrochlorothiazide (BENICAR HCT) 20-12.5 MG tablet Take 1 tablet by mouth daily.     omeprazole (PRILOSEC) 20 MG capsule Take 20 mg by mouth daily. Per pt and takes if needed in the evening     REPATHA SURECLICK 140 MG/ML SOAJ Inject 1 mL into the skin every 14 (fourteen) days.     risedronate (ACTONEL) 150 MG tablet Take 150 mg by mouth every 30 (thirty) days.     sertraline (ZOLOFT) 100 MG tablet Take 100 mg by mouth every morning.     Vitamin  E 200 units TABS Take 200 Units by mouth daily.     ZETIA 10 MG tablet Take 10 mg by mouth at bedtime.     No facility-administered medications prior to visit.    Review of Systems  Constitutional:  Negative for chills, diaphoresis, fever, malaise/fatigue and weight loss.  HENT:  Negative for congestion.   Respiratory:  Negative for cough, hemoptysis, sputum production, shortness of breath and wheezing.   Cardiovascular:  Negative for chest pain, palpitations and leg swelling.  Endo/Heme/Allergies:  Positive for environmental allergies.    Objective:   Vitals:   10/29/23 1308  BP: 110/62  Pulse: 66  SpO2: 93%  Weight: 127 lb 14.4 oz (58 kg)  Height: 4' 10.66" (1.49 m)    SpO2: 93 %  Physical Exam: General: Well-appearing, no acute distress HENT: [place], AT Eyes: EOMI, no scleral icterus Respiratory: Clear to auscultation bilaterally.  No crackles, wheezing or rales Cardiovascular: RRR, -M/R/G, no JVD Extremities:-Edema,-tenderness Neuro: AAO x4, CNII-XII grossly intact Psych: Normal mood, normal affect   Data Reviewed:  Imaging: CT Chest 03/24/22 - No lymphadenopathy. RLL with minimal scarring  CT Coronary 05/16/22 - Visualized lung parenchyma with tiny LUL cluster pulmonary nodules in LUL. No masses, infiltrate, effusion or pneumothorax.  PFT: 07/07/22 FVC 1.65 (84%) FEV1 1.14 (79%) Ratio 69  TLC 99% DLCO 67% Interpretation: Mild moderate obstructive defect with significant bronchodilator response with air trapping and mildly reduced DLCO   Labs: CBC    Component Value Date/Time   WBC 6.1 07/23/2020 1904   RBC 4.13 07/23/2020 1904   HGB 14.3 02/22/2021 1000   HCT 42.0 02/22/2021 1000   PLT 183 07/23/2020 1904   MCV 96.1 07/23/2020 1904   MCH 31.7 07/23/2020 1904   MCHC 33.0 07/23/2020 1904   RDW 13.3 07/23/2020 1904   LYMPHSABS 1.7 07/23/2020 1904   MONOABS 0.2 07/23/2020 1904   EOSABS 0.1 07/23/2020 1904   BASOSABS 0.1 07/23/2020 1904   Absolute eos  07/23/20 - 100     Assessment & Plan:   Discussion: 83 year old female former smoker with HTN, HLD, postsurgical hypothyroidism, GERD, IBS, osteoporosis, interstitial cystitis, palpitations who presents for COPD-asthma follow-up. Improved with PRN ICS/LABA. Improved with bronchodilator use. Discussed clinical course and management of COPD/asthma including bronchodilator regimen, preventive care including vaccinations and action plan for exacerbation.   COPD-asthma overlap --STOP Advair HFA 115 mcg due to insurance coverage --START Symbicort 160-4.5 TWO puffs in the morning and evening --CONTINUE Albuterol AS NEEDED for shortness of breath or wheezing  Abnormal CT Reviewed CT chest. Tiny subcentimeter nodules in LUL. Non-specific and does not warrant following.  Nasal congestion --CONTINUE Flonase  --CONTINUE atrovent in the morning  Former tobacco abuse Congratulations!  Health Maintenance Immunization History  Administered Date(s) Administered   Influenza, High Dose Seasonal PF 04/09/2018   PFIZER(Purple Top)SARS-COV-2 Vaccination 09/07/2019, 10/01/2019   CT Lung Screen - not qualified due to age  No orders of the defined types were placed in this encounter.  Meds ordered this encounter  Medications   SYMBICORT 160-4.5 MCG/ACT inhaler    Sig: Inhale 2 puffs into the lungs daily.    Dispense:  10.2 each    Refill:  12    Return in about 1 year (around 10/28/2024).  I have spent a total time of 30-minutes on the day of the appointment including chart review, data review, collecting history, coordinating care and discussing medical diagnosis and plan with the patient/family. Past medical history, allergies, medications were reviewed. Pertinent imaging, labs and tests included in this note have been reviewed and interpreted independently by me.  Lelend Heinecke Mechele Collin, MD [facility] 10/29/2023 2:15 PM

## 2023-10-30 ENCOUNTER — Other Ambulatory Visit (HOSPITAL_COMMUNITY): Payer: Self-pay
